# Patient Record
Sex: Female | Born: 1980 | Race: White | Hispanic: No | Marital: Married | Smoking: Former smoker
Health system: Southern US, Community
[De-identification: ages and names within clinical notes are randomized; demographics above are authoritative.]

## PROBLEM LIST (undated history)

## (undated) DIAGNOSIS — F909 Attention-deficit hyperactivity disorder, unspecified type: Secondary | ICD-10-CM

## (undated) DIAGNOSIS — T7840XA Allergy, unspecified, initial encounter: Secondary | ICD-10-CM

## (undated) HISTORY — DX: Allergy, unspecified, initial encounter: T78.40XA

## (undated) HISTORY — DX: Attention-deficit hyperactivity disorder, unspecified type: F90.9

## (undated) HISTORY — PX: WISDOM TOOTH EXTRACTION: SHX21

---

## 2003-04-14 ENCOUNTER — Emergency Department (HOSPITAL_COMMUNITY): Admission: EM | Admit: 2003-04-14 | Discharge: 2003-04-14 | Payer: Self-pay | Admitting: Emergency Medicine

## 2005-05-10 ENCOUNTER — Other Ambulatory Visit: Admission: RE | Admit: 2005-05-10 | Discharge: 2005-05-10 | Payer: Self-pay | Admitting: Gynecology

## 2006-05-12 ENCOUNTER — Other Ambulatory Visit: Admission: RE | Admit: 2006-05-12 | Discharge: 2006-05-12 | Payer: Self-pay | Admitting: Gynecology

## 2007-04-05 ENCOUNTER — Ambulatory Visit: Payer: Self-pay | Admitting: *Deleted

## 2007-04-27 ENCOUNTER — Encounter: Payer: Self-pay | Admitting: Family Medicine

## 2007-04-27 ENCOUNTER — Ambulatory Visit: Payer: Self-pay | Admitting: Family Medicine

## 2007-04-27 DIAGNOSIS — J309 Allergic rhinitis, unspecified: Secondary | ICD-10-CM | POA: Insufficient documentation

## 2007-04-27 DIAGNOSIS — L2089 Other atopic dermatitis: Secondary | ICD-10-CM | POA: Insufficient documentation

## 2007-04-27 DIAGNOSIS — L309 Dermatitis, unspecified: Secondary | ICD-10-CM | POA: Insufficient documentation

## 2007-04-27 DIAGNOSIS — F988 Other specified behavioral and emotional disorders with onset usually occurring in childhood and adolescence: Secondary | ICD-10-CM | POA: Insufficient documentation

## 2007-05-03 ENCOUNTER — Ambulatory Visit: Payer: Self-pay | Admitting: Family Medicine

## 2007-05-03 LAB — CONVERTED CEMR LAB
Basophils Absolute: 0 10*3/uL (ref 0.0–0.1)
Bilirubin, Direct: 0.1 mg/dL (ref 0.0–0.3)
Calcium: 9.2 mg/dL (ref 8.4–10.5)
Cholesterol: 173 mg/dL (ref 0–200)
Eosinophils Absolute: 0 10*3/uL (ref 0.0–0.6)
Eosinophils Relative: 0.5 % (ref 0.0–5.0)
GFR calc Af Amer: 155 mL/min
GFR calc non Af Amer: 128 mL/min
Glucose, Bld: 83 mg/dL (ref 70–99)
Lymphocytes Relative: 32.2 % (ref 12.0–46.0)
MCHC: 35.2 g/dL (ref 30.0–36.0)
MCV: 89.9 fL (ref 78.0–100.0)
Neutro Abs: 4.8 10*3/uL (ref 1.4–7.7)
Neutrophils Relative %: 60.2 % (ref 43.0–77.0)
Platelets: 270 10*3/uL (ref 150–400)
Sodium: 140 meq/L (ref 135–145)
TSH: 2.58 microintl units/mL (ref 0.35–5.50)
Triglycerides: 119 mg/dL (ref 0–149)
WBC: 7.9 10*3/uL (ref 4.5–10.5)

## 2007-05-23 ENCOUNTER — Telehealth (INDEPENDENT_AMBULATORY_CARE_PROVIDER_SITE_OTHER): Payer: Self-pay | Admitting: *Deleted

## 2007-05-24 ENCOUNTER — Other Ambulatory Visit: Admission: RE | Admit: 2007-05-24 | Discharge: 2007-05-24 | Payer: Self-pay | Admitting: Obstetrics and Gynecology

## 2007-06-12 ENCOUNTER — Telehealth (INDEPENDENT_AMBULATORY_CARE_PROVIDER_SITE_OTHER): Payer: Self-pay | Admitting: *Deleted

## 2007-07-13 ENCOUNTER — Telehealth (INDEPENDENT_AMBULATORY_CARE_PROVIDER_SITE_OTHER): Payer: Self-pay | Admitting: *Deleted

## 2007-08-10 ENCOUNTER — Telehealth (INDEPENDENT_AMBULATORY_CARE_PROVIDER_SITE_OTHER): Payer: Self-pay | Admitting: *Deleted

## 2007-09-07 ENCOUNTER — Telehealth (INDEPENDENT_AMBULATORY_CARE_PROVIDER_SITE_OTHER): Payer: Self-pay | Admitting: *Deleted

## 2007-10-06 ENCOUNTER — Telehealth (INDEPENDENT_AMBULATORY_CARE_PROVIDER_SITE_OTHER): Payer: Self-pay | Admitting: *Deleted

## 2007-11-02 ENCOUNTER — Telehealth (INDEPENDENT_AMBULATORY_CARE_PROVIDER_SITE_OTHER): Payer: Self-pay | Admitting: *Deleted

## 2007-11-03 ENCOUNTER — Ambulatory Visit: Payer: Self-pay | Admitting: Family Medicine

## 2007-11-27 ENCOUNTER — Encounter: Payer: Self-pay | Admitting: Family Medicine

## 2007-12-28 ENCOUNTER — Telehealth (INDEPENDENT_AMBULATORY_CARE_PROVIDER_SITE_OTHER): Payer: Self-pay | Admitting: *Deleted

## 2008-01-29 ENCOUNTER — Ambulatory Visit: Payer: Self-pay | Admitting: Family Medicine

## 2008-01-29 ENCOUNTER — Telehealth (INDEPENDENT_AMBULATORY_CARE_PROVIDER_SITE_OTHER): Payer: Self-pay | Admitting: *Deleted

## 2008-01-29 DIAGNOSIS — G5600 Carpal tunnel syndrome, unspecified upper limb: Secondary | ICD-10-CM | POA: Insufficient documentation

## 2008-02-12 ENCOUNTER — Telehealth (INDEPENDENT_AMBULATORY_CARE_PROVIDER_SITE_OTHER): Payer: Self-pay | Admitting: *Deleted

## 2008-02-23 ENCOUNTER — Telehealth (INDEPENDENT_AMBULATORY_CARE_PROVIDER_SITE_OTHER): Payer: Self-pay | Admitting: *Deleted

## 2008-03-22 ENCOUNTER — Telehealth (INDEPENDENT_AMBULATORY_CARE_PROVIDER_SITE_OTHER): Payer: Self-pay | Admitting: *Deleted

## 2008-04-19 ENCOUNTER — Telehealth (INDEPENDENT_AMBULATORY_CARE_PROVIDER_SITE_OTHER): Payer: Self-pay | Admitting: *Deleted

## 2008-05-17 ENCOUNTER — Telehealth (INDEPENDENT_AMBULATORY_CARE_PROVIDER_SITE_OTHER): Payer: Self-pay | Admitting: *Deleted

## 2008-05-27 ENCOUNTER — Other Ambulatory Visit: Admission: RE | Admit: 2008-05-27 | Discharge: 2008-05-27 | Payer: Self-pay | Admitting: Obstetrics and Gynecology

## 2008-06-12 ENCOUNTER — Telehealth (INDEPENDENT_AMBULATORY_CARE_PROVIDER_SITE_OTHER): Payer: Self-pay | Admitting: *Deleted

## 2008-07-12 ENCOUNTER — Telehealth (INDEPENDENT_AMBULATORY_CARE_PROVIDER_SITE_OTHER): Payer: Self-pay | Admitting: *Deleted

## 2008-08-08 ENCOUNTER — Telehealth (INDEPENDENT_AMBULATORY_CARE_PROVIDER_SITE_OTHER): Payer: Self-pay | Admitting: *Deleted

## 2008-08-16 ENCOUNTER — Ambulatory Visit: Payer: Self-pay | Admitting: Family Medicine

## 2008-08-16 DIAGNOSIS — F17200 Nicotine dependence, unspecified, uncomplicated: Secondary | ICD-10-CM | POA: Insufficient documentation

## 2008-09-04 ENCOUNTER — Telehealth (INDEPENDENT_AMBULATORY_CARE_PROVIDER_SITE_OTHER): Payer: Self-pay | Admitting: *Deleted

## 2008-10-03 ENCOUNTER — Telehealth (INDEPENDENT_AMBULATORY_CARE_PROVIDER_SITE_OTHER): Payer: Self-pay | Admitting: *Deleted

## 2008-10-29 ENCOUNTER — Telehealth (INDEPENDENT_AMBULATORY_CARE_PROVIDER_SITE_OTHER): Payer: Self-pay | Admitting: *Deleted

## 2008-11-28 ENCOUNTER — Telehealth (INDEPENDENT_AMBULATORY_CARE_PROVIDER_SITE_OTHER): Payer: Self-pay | Admitting: *Deleted

## 2008-12-26 ENCOUNTER — Telehealth (INDEPENDENT_AMBULATORY_CARE_PROVIDER_SITE_OTHER): Payer: Self-pay | Admitting: *Deleted

## 2009-01-22 ENCOUNTER — Telehealth (INDEPENDENT_AMBULATORY_CARE_PROVIDER_SITE_OTHER): Payer: Self-pay | Admitting: *Deleted

## 2009-02-21 ENCOUNTER — Telehealth (INDEPENDENT_AMBULATORY_CARE_PROVIDER_SITE_OTHER): Payer: Self-pay | Admitting: *Deleted

## 2009-03-19 ENCOUNTER — Ambulatory Visit: Payer: Self-pay | Admitting: Family Medicine

## 2009-03-20 ENCOUNTER — Ambulatory Visit: Payer: Self-pay | Admitting: Women's Health

## 2009-04-17 ENCOUNTER — Telehealth (INDEPENDENT_AMBULATORY_CARE_PROVIDER_SITE_OTHER): Payer: Self-pay | Admitting: *Deleted

## 2009-05-15 ENCOUNTER — Telehealth (INDEPENDENT_AMBULATORY_CARE_PROVIDER_SITE_OTHER): Payer: Self-pay | Admitting: *Deleted

## 2009-05-28 ENCOUNTER — Encounter: Payer: Self-pay | Admitting: Women's Health

## 2009-05-28 ENCOUNTER — Other Ambulatory Visit: Admission: RE | Admit: 2009-05-28 | Discharge: 2009-05-28 | Payer: Self-pay | Admitting: Obstetrics and Gynecology

## 2009-05-28 ENCOUNTER — Ambulatory Visit: Payer: Self-pay | Admitting: Women's Health

## 2009-06-12 ENCOUNTER — Telehealth (INDEPENDENT_AMBULATORY_CARE_PROVIDER_SITE_OTHER): Payer: Self-pay | Admitting: *Deleted

## 2009-07-09 ENCOUNTER — Telehealth (INDEPENDENT_AMBULATORY_CARE_PROVIDER_SITE_OTHER): Payer: Self-pay | Admitting: *Deleted

## 2009-08-05 ENCOUNTER — Telehealth (INDEPENDENT_AMBULATORY_CARE_PROVIDER_SITE_OTHER): Payer: Self-pay | Admitting: *Deleted

## 2009-09-02 ENCOUNTER — Ambulatory Visit: Payer: Self-pay | Admitting: Family Medicine

## 2009-10-01 ENCOUNTER — Telehealth (INDEPENDENT_AMBULATORY_CARE_PROVIDER_SITE_OTHER): Payer: Self-pay | Admitting: *Deleted

## 2009-10-29 ENCOUNTER — Telehealth (INDEPENDENT_AMBULATORY_CARE_PROVIDER_SITE_OTHER): Payer: Self-pay | Admitting: *Deleted

## 2009-11-26 ENCOUNTER — Telehealth (INDEPENDENT_AMBULATORY_CARE_PROVIDER_SITE_OTHER): Payer: Self-pay | Admitting: *Deleted

## 2009-12-25 ENCOUNTER — Telehealth (INDEPENDENT_AMBULATORY_CARE_PROVIDER_SITE_OTHER): Payer: Self-pay | Admitting: *Deleted

## 2010-01-22 ENCOUNTER — Telehealth (INDEPENDENT_AMBULATORY_CARE_PROVIDER_SITE_OTHER): Payer: Self-pay | Admitting: *Deleted

## 2010-02-18 ENCOUNTER — Telehealth (INDEPENDENT_AMBULATORY_CARE_PROVIDER_SITE_OTHER): Payer: Self-pay | Admitting: *Deleted

## 2010-03-18 ENCOUNTER — Ambulatory Visit: Payer: Self-pay | Admitting: Family Medicine

## 2010-03-18 DIAGNOSIS — L708 Other acne: Secondary | ICD-10-CM

## 2010-03-18 DIAGNOSIS — L700 Acne vulgaris: Secondary | ICD-10-CM | POA: Insufficient documentation

## 2010-04-16 ENCOUNTER — Telehealth (INDEPENDENT_AMBULATORY_CARE_PROVIDER_SITE_OTHER): Payer: Self-pay | Admitting: *Deleted

## 2010-05-14 ENCOUNTER — Telehealth (INDEPENDENT_AMBULATORY_CARE_PROVIDER_SITE_OTHER): Payer: Self-pay | Admitting: *Deleted

## 2010-05-29 ENCOUNTER — Ambulatory Visit: Payer: Self-pay | Admitting: Women's Health

## 2010-05-29 ENCOUNTER — Other Ambulatory Visit: Admission: RE | Admit: 2010-05-29 | Discharge: 2010-05-29 | Payer: Self-pay | Admitting: Obstetrics and Gynecology

## 2010-06-10 ENCOUNTER — Telehealth (INDEPENDENT_AMBULATORY_CARE_PROVIDER_SITE_OTHER): Payer: Self-pay | Admitting: *Deleted

## 2010-07-08 ENCOUNTER — Telehealth: Payer: Self-pay | Admitting: Family Medicine

## 2010-08-06 ENCOUNTER — Telehealth (INDEPENDENT_AMBULATORY_CARE_PROVIDER_SITE_OTHER): Payer: Self-pay | Admitting: *Deleted

## 2010-09-02 ENCOUNTER — Telehealth (INDEPENDENT_AMBULATORY_CARE_PROVIDER_SITE_OTHER): Payer: Self-pay | Admitting: *Deleted

## 2010-09-29 ENCOUNTER — Telehealth (INDEPENDENT_AMBULATORY_CARE_PROVIDER_SITE_OTHER): Payer: Self-pay | Admitting: *Deleted

## 2010-10-02 ENCOUNTER — Telehealth: Payer: Self-pay | Admitting: Family Medicine

## 2010-10-23 ENCOUNTER — Ambulatory Visit: Payer: Self-pay | Admitting: Family Medicine

## 2010-11-24 ENCOUNTER — Telehealth (INDEPENDENT_AMBULATORY_CARE_PROVIDER_SITE_OTHER): Payer: Self-pay | Admitting: *Deleted

## 2010-12-22 ENCOUNTER — Telehealth (INDEPENDENT_AMBULATORY_CARE_PROVIDER_SITE_OTHER): Payer: Self-pay | Admitting: *Deleted

## 2011-01-18 ENCOUNTER — Telehealth (INDEPENDENT_AMBULATORY_CARE_PROVIDER_SITE_OTHER): Payer: Self-pay | Admitting: *Deleted

## 2011-01-19 NOTE — Progress Notes (Signed)
Summary: refill  Phone Note Refill Request Message from:  Patient on July 08, 2010 1:04 PM  Refills Requested: Medication #1:  ADDERALL 10 MG  TABS 1 by mouth qam and 2 by mouth at 12noon patient will pick up - thursday [id number] afternoon  Initial call taken by: Okey Regal Spring,  July 08, 2010 1:05 PM  Follow-up for Phone Call        Please advise. Lucious Groves CMA  July 08, 2010 4:20 PM   Additional Follow-up for Phone Call Additional follow up Details #1::        rx placed up front for pick-up.Felecia Deloach CMA  July 09, 2010 12:05 PM     Prescriptions: ADDERALL 10 MG  TABS (AMPHETAMINE-DEXTROAMPHETAMINE) 1 by mouth qam and 2 by mouth at 12noon  #90 x 0   Entered by:   Jeremy Johann CMA   Authorized by:   Loreen Freud DO   Signed by:   Jeremy Johann CMA on 07/09/2010   Method used:   Print then Give to Patient   RxID:   [id number]

## 2011-01-19 NOTE — Progress Notes (Signed)
Summary: refill  Phone Note Refill Request Message from:  Patient on September 29, 2010 3:09 PM  Refills Requested: Medication #1:  ADDERALL 10 MG  TABS 1 by mouth qam and 2 by mouth at 12noon patient will pick up [id number]  Initial call taken by: Okey Regal Spring,  September 29, 2010 3:10 PM    Prescriptions: ADDERALL 10 MG  TABS (AMPHETAMINE-DEXTROAMPHETAMINE) 1 by mouth qam and 2 by mouth at 12noon  #90 x 0   Entered by:   Almeta Monas CMA (AAMA)   Authorized by:   Loreen Freud DO   Signed by:   Almeta Monas CMA (AAMA) on 09/29/2010   Method used:   Print then Give to Patient   RxID:   [id number]  Rx printed  and left at check In.   Almeta Monas CMA Duncan Dull)  September 29, 2010 4:54 PM

## 2011-01-19 NOTE — Progress Notes (Signed)
  Phone Note Refill Request   Refills Requested: Medication #1:  ADDERALL 10 MG  TABS 1 by mouth qam and 2 by mouth at 12noon  Follow-up for Phone Call        Pt aware rx is ready Army Fossa CMA  February 18, 2010 1:19 PM     Prescriptions: ADDERALL 10 MG  TABS (AMPHETAMINE-DEXTROAMPHETAMINE) 1 by mouth qam and 2 by mouth at 12noon  #90 x 0   Entered by:   Army Fossa CMA   Authorized by:   Loreen Freud DO   Signed by:   Army Fossa CMA on 02/18/2010   Method used:   Print then Give to Patient   RxID:   [id number]

## 2011-01-19 NOTE — Progress Notes (Signed)
Summary: Refill   Phone Note Refill Request Call back at Work Phone [phone] Message from:  Patient on September 02, 2010 9:11 AM  Refills Requested: Medication #1:  ADDERALL 10 MG  TABS 1 by mouth qam and 2 by mouth at 12noon   Dosage confirmed as above?Dosage Confirmed   Supply Requested: 3 months   Last Refilled: 08/06/2010  Method Requested: Pick up at Office Next Appointment Scheduled: none Initial call taken by: Lavell Islam,  September 02, 2010 9:11 AM    Prescriptions: ADDERALL 10 MG  TABS (AMPHETAMINE-DEXTROAMPHETAMINE) 1 by mouth qam and 2 by mouth at 12noon  #90 x 0   Entered by:   Almeta Monas CMA (AAMA)   Authorized by:   Loreen Freud DO   Signed by:   Almeta Monas CMA (AAMA) on 09/02/2010   Method used:   Print then Give to Patient   RxID:   [id number]

## 2011-01-19 NOTE — Progress Notes (Signed)
Summary: NEEDS ADDERRAL 10  PRESCRIPTION  Phone Note Call from Patient   Caller: Patient Summary of Call: NEEDS PRESCRIPTION FOR ADDERRAL 10  ---TAKES IT THREE TIMES A DAY  TOLD HER IT WOULD BE READY 24 HOURS FROM NOW Initial call taken by: Jerolyn Shin,  June 10, 2010 12:20 PM  Follow-up for Phone Call        upfront for pt to pick up. Army Fossa CMA  June 10, 2010 1:40 PM     Prescriptions: ADDERALL 10 MG  TABS (AMPHETAMINE-DEXTROAMPHETAMINE) 1 by mouth qam and 2 by mouth at 12noon  #90 x 0   Entered by:   Army Fossa CMA   Authorized by:   Loreen Freud DO   Signed by:   Army Fossa CMA on 06/10/2010   Method used:   Print then Give to Patient   RxID:   [id number]

## 2011-01-19 NOTE — Progress Notes (Signed)
Summary: Refill  Phone Note Refill Request Call back at Home Phone [phone] Message from:  Patient on November 24, 2010 12:33 PM  Refills Requested: Medication #1:  RITALIN 10 MG TABS 1 three times a day as needed   Dosage confirmed as above?Dosage Confirmed   Supply Requested: 1 month  Method Requested: Pick up at Office Initial call taken by: Lavell Islam,  November 24, 2010 12:33 PM  Follow-up for Phone Call        Pt aware Rx ready for pick up Follow-up by: Almeta Monas CMA Duncan Dull),  November 24, 2010 4:49 PM    Prescriptions: RITALIN 10 MG TABS (METHYLPHENIDATE HCL) 1 three times a day as needed  #90 x 0   Entered by:   Almeta Monas CMA (AAMA)   Authorized by:   Loreen Freud DO   Signed by:   Almeta Monas CMA (AAMA) on 11/24/2010   Method used:   Print then Give to Patient   RxID:   [id number]

## 2011-01-19 NOTE — Progress Notes (Signed)
Summary: Refill Request  Phone Note Refill Request Call back at Home Phone [phone] Message from:  Patient on May 14, 2010 12:14 PM  Refills Requested: Medication #1:  ADDERALL 10 MG  TABS 1 by mouth qam and 2 by mouth at 12noon   Dosage confirmed as above?Dosage Confirmed   Supply Requested: 1 month  Method Requested: Pick up at Office Next Appointment Scheduled: none Initial call taken by: Harold Barban,  May 14, 2010 12:14 PM  Follow-up for Phone Call        Pt is aware rx will be ready in 1 hr. Army Fossa CMA  May 14, 2010 12:57 PM     Prescriptions: ADDERALL 10 MG  TABS (AMPHETAMINE-DEXTROAMPHETAMINE) 1 by mouth qam and 2 by mouth at 12noon  #90 x 0   Entered by:   Army Fossa CMA   Authorized by:   Loreen Freud DO   Signed by:   Army Fossa CMA on 05/14/2010   Method used:   Print then Give to Patient   RxID:   [id number]

## 2011-01-19 NOTE — Progress Notes (Signed)
Summary: refill adderall  Phone Note Refill Request Call back at Work Phone [phone] Message from:  Patient on April 16, 2010 12:30 PM  Refills Requested: Medication #1:  ADDERALL 10 MG  TABS 1 by mouth qam and 2 by mouth at 12noon call when ready   Method Requested: Pick up at Office Initial call taken by: Okey Regal Spring,  April 16, 2010 12:31 PM  Follow-up for Phone Call        left message rx is ready. Army Fossa CMA  April 16, 2010 1:10 PM     Prescriptions: ADDERALL 10 MG  TABS (AMPHETAMINE-DEXTROAMPHETAMINE) 1 by mouth qam and 2 by mouth at 12noon  #90 x 0   Entered by:   Army Fossa CMA   Authorized by:   Loreen Freud DO   Signed by:   Army Fossa CMA on 04/16/2010   Method used:   Print then Give to Patient   RxID:   [id number]

## 2011-01-19 NOTE — Progress Notes (Signed)
Summary: refill request  Phone Note Refill Request Message from:  Patient on August 06, 2010 12:42 PM  Refills Requested: Medication #1:  ADDERALL 10 MG  TABS 1 by mouth qam and 2 by mouth at 12noon   Dosage confirmed as above?Dosage Confirmed   Supply Requested: 1 month   Last Refilled: 07/09/2010 pt aware ready for pickup in 24 hrs  Next Appointment Scheduled: none Initial call taken by: Lavell Islam,  August 06, 2010 12:43 PM    Prescriptions: ADDERALL 10 MG  TABS (AMPHETAMINE-DEXTROAMPHETAMINE) 1 by mouth qam and 2 by mouth at 12noon  #90 x 0   Entered by:   Jeremy Johann CMA   Authorized by:   Loreen Freud DO   Signed by:   Jeremy Johann CMA on 08/06/2010   Method used:   Print then Give to Patient   RxID:   [id number]

## 2011-01-19 NOTE — Progress Notes (Signed)
  Phone Note Refill Request   Refills Requested: Medication #1:  ADDERALL 10 MG  TABS 1 by mouth qam and 2 by mouth at 12noon  Follow-up for Phone Call        pt is aware rx is ready. Army Fossa CMA  January 22, 2010 10:54 AM     Prescriptions: ADDERALL 10 MG  TABS (AMPHETAMINE-DEXTROAMPHETAMINE) 1 by mouth qam and 2 by mouth at 12noon  #90 x 0   Entered by:   Army Fossa CMA   Authorized by:   Loreen Freud DO   Signed by:   Army Fossa CMA on 01/22/2010   Method used:   Print then Give to Patient   RxID:   [id number]

## 2011-01-19 NOTE — Progress Notes (Signed)
  Phone Note Refill Request   Refills Requested: Medication #1:  ADDERALL 10 MG  TABS 1 by mouth qam and 2 by mouth at 12noon Initial call taken by: Army Fossa CMA,  December 25, 2009 2:44 PM  Follow-up for Phone Call        left message that rx was ready.  Follow-up by: Army Fossa CMA,  December 25, 2009 2:45 PM    Prescriptions: ADDERALL 10 MG  TABS (AMPHETAMINE-DEXTROAMPHETAMINE) 1 by mouth qam and 2 by mouth at 12noon  #90 x 0   Entered by:   Army Fossa CMA   Authorized by:   Loreen Freud DO   Signed by:   Army Fossa CMA on 12/25/2009   Method used:   Print then Give to Patient   RxID:   [id number]

## 2011-01-19 NOTE — Assessment & Plan Note (Signed)
Summary: FU ON MEDS/KDC   Vital Signs:  Patient profile:   30 year old female Height:      66 inches Weight:      180.13 pounds BMI:     29.18 Temp:     97.7 degrees F oral Pulse rate:   76 / minute Pulse rhythm:   regular BP sitting:   122 / 80  (left arm) Cuff size:   regular  Vitals Entered By: Army Fossa CMA (March 18, 2010 9:33 AM) CC: follow up on meds.    History of Present Illness: Pt here for f/u add---pt doing well with dose and has stopped smoking except in very stressful situations.   Pt also c/o break out on R check.   No sure what its from.  She has a hx of eczema and skin is dry but also has some "pimples".    Current Medications (verified): 1)  Adderall 10 Mg  Tabs (Amphetamine-Dextroamphetamine) .Marland Kitchen.. 1 By Mouth Qam and 2 By Mouth At 12noon 2)  Nicotrol 10 Mg Inha (Nicotine) .... 6-16 Cartridges Inh Qd 3)  Estrostep Fe 1-20/1-30/1-35 Mg-Mcg Tabs (Norethindron-Ethinyl Estrad-Fe) .... As Directed 4)  Alprazolam 0.25 Mg Tabs (Alprazolam) .Marland Kitchen.. 1 By Mouth As Needed. 5)  Loestrin 1/20 (21) 1-20 Mg-Mcg Tabs (Norethindrone Acet-Ethinyl Est) .Marland Kitchen.. 1 By Mouth Once Daily. 6)  Cleocin-T 1 % Lotn (Clindamycin Phosphate) .... Apply Once Daily  Allergies (verified): No Known Drug Allergies  Past History:  Past medical, surgical, family and social histories (including risk factors) reviewed for relevance to current acute and chronic problems.  Past Medical History: Reviewed history from 04/27/2007 and no changes required. Allergic rhinitis  Family History: Reviewed history from 04/27/2007 and no changes required. Family History of Alcoholism/Addiction Family History Depression  Social History: Reviewed history from 04/27/2007 and no changes required. Occupation: Community education officer- data reporting Single Current Smoker Alcohol use-yes Drug use-no Regular exercise-yes  Review of Systems      See HPI  Physical Exam  General:  Well-developed,well-nourished,in no  acute distress; alert,appropriate and cooperative throughout examination Lungs:  Normal respiratory effort, chest expands symmetrically. Lungs are clear to auscultation, no crackles or wheezes. Heart:  normal rate and no murmur.   Skin:  + dry skin R cheek some papules Psych:  Oriented X3 and normally interactive.     Impression & Recommendations:  Problem # 1:  ADD (ICD-314.00)  refill adderall rto 6 months  Problem # 2:  TOBACCO ABUSE (ICD-305.1)  Her updated medication list for this problem includes:    Nicotrol 10 Mg Inha (Nicotine) .Marland Kitchen... 6-16 cartridges inh qd  Encouraged smoking cessation and discussed different methods for smoking cessation.   Problem # 3:  ACNE, MILD (ICD-706.1)  Her updated medication list for this problem includes:    Cleocin-t 1 % Lotn (Clindamycin phosphate) .Marland Kitchen... Apply once daily  Discussed care of the skin and different treatment options.   Complete Medication List: 1)  Adderall 10 Mg Tabs (Amphetamine-dextroamphetamine) .Marland Kitchen.. 1 by mouth qam and 2 by mouth at 12noon 2)  Nicotrol 10 Mg Inha (Nicotine) .... 6-16 cartridges inh qd 3)  Estrostep Fe 1-20/1-30/1-35 Mg-mcg Tabs (Norethindron-ethinyl estrad-fe) .... As directed 4)  Alprazolam 0.25 Mg Tabs (Alprazolam) .Marland Kitchen.. 1 by mouth as needed. 5)  Loestrin 1/20 (21) 1-20 Mg-mcg Tabs (Norethindrone acet-ethinyl est) .Marland Kitchen.. 1 by mouth once daily. 6)  Cleocin-t 1 % Lotn (Clindamycin phosphate) .... Apply once daily Prescriptions: CLEOCIN-T 1 % LOTN (CLINDAMYCIN PHOSPHATE) apply once daily  #1  bottle x 0   Entered and Authorized by:   Loreen Freud DO   Signed by:   Loreen Freud DO on 03/18/2010   Method used:   Electronically to        [facility] [phone]* (retail)       [address]       [place], [place]  [address]       Ph: [phone]       Fax: [phone]   RxID:   [id number] ADDERALL 10 MG  TABS (AMPHETAMINE-DEXTROAMPHETAMINE) 1 by mouth qam and 2 by mouth at  12noon  #90 x 0   Entered and Authorized by:   Loreen Freud DO   Signed by:   Loreen Freud DO on 03/18/2010   Method used:   Print then Give to Patient   RxID:   [id number]

## 2011-01-19 NOTE — Progress Notes (Signed)
Summary: Change Meds/dosage on Adderrall  Phone Note Call from Patient   Caller: Patient Call For: Loreen Freud DO Details for Reason: Change Meds Summary of Call: Pt came into the office and stated that the pharmacy that she uses dose not have the Adderrall in 10mg , it is only avail in 5mg , pt wants to know if she can get the Rx rewritten for 5mg  so she won't have to go to a different Pharmacy. Please advise Initial call taken by: Almeta Monas CMA Duncan Dull),  October 02, 2010 12:50 PM  Follow-up for Phone Call        done Follow-up by: Loreen Freud DO,  October 02, 2010 1:10 PM  Additional Follow-up for Phone Call Additional follow up Details #1::        Patient notified that prescription is up front for pickup. Per the patient her pharmacy made her aware that they may not have the total pills she needs, but they will give as much as they can and the she will most likely need a refill for the remaining qty.  Additional Follow-up by: Lucious Groves CMA,  October 02, 2010 1:21 PM    New/Updated Medications: ADDERALL 5 MG TABS (AMPHETAMINE-DEXTROAMPHETAMINE) 2 by mouth qam and 4 by mouth 12 noon Prescriptions: ADDERALL 5 MG TABS (AMPHETAMINE-DEXTROAMPHETAMINE) 2 by mouth qam and 4 by mouth 12 noon  #180 x 0   Entered and Authorized by:   Loreen Freud DO   Signed by:   Loreen Freud DO on 10/02/2010   Method used:   Print then Give to Patient   RxID:   [id number]

## 2011-01-19 NOTE — Assessment & Plan Note (Signed)
Summary: 6 month followup///sph   Vital Signs:  Patient profile:   30 year old female Weight:      181.2 pounds Pulse rate:   80 / minute Pulse rhythm:   regular BP sitting:   118 / 72  (right arm) Cuff size:   regular  Vitals Entered By: Almeta Monas CMA Duncan Dull) (October 23, 2010 10:42 AM) CC: 6 mo Adderall f/u   History of Present Illness: Pt here f/u adderall.  Pt doing well with it---- no side effects.   Pt willing to switch to ritalin since pharmacy is out of adderall.  Current Medications (verified): 1)  Ritalin 10 Mg Tabs (Methylphenidate Hcl) .Marland Kitchen.. 1 Three Times A Day As Needed 2)  Nicotrol 10 Mg Inha (Nicotine) .... 6-16 Cartridges Inh Qd 3)  Alprazolam 0.25 Mg Tabs (Alprazolam) .Marland Kitchen.. 1 By Mouth As Needed. 4)  Loestrin 1/20 (21) 1-20 Mg-Mcg Tabs (Norethindrone Acet-Ethinyl Est) .Marland Kitchen.. 1 By Mouth Once Daily. 5)  Cleocin-T 1 % Lotn (Clindamycin Phosphate) .... Apply Once Daily  Allergies (verified): No Known Drug Allergies  Past History:  Past medical, surgical, family and social histories (including risk factors) reviewed for relevance to current acute and chronic problems.  Past Medical History: Reviewed history from 04/27/2007 and no changes required. Allergic rhinitis  Family History: Reviewed history from 04/27/2007 and no changes required. Family History of Alcoholism/Addiction Family History Depression  Social History: Reviewed history from 04/27/2007 and no changes required. Occupation: Community education officer- data reporting Single Current Smoker Alcohol use-yes Drug use-no Regular exercise-yes  Review of Systems      See HPI  Physical Exam  General:  Well-developed,well-nourished,in no acute distress; alert,appropriate and cooperative throughout examination Lungs:  Normal respiratory effort, chest expands symmetrically. Lungs are clear to auscultation, no crackles or wheezes. Heart:  Normal rate and regular rhythm. S1 and S2 normal without gallop, murmur,  click, rub or other extra sounds. Psych:  Cognition and judgment appear intact. Alert and cooperative with normal attention span and concentration. No apparent delusions, illusions, hallucinations   Impression & Recommendations:  Problem # 1:  ADD (ICD-314.00) ritalin 10 mg 1 by mouth three times a day  rto 6 months or sooner prn  Complete Medication List: 1)  Ritalin 10 Mg Tabs (Methylphenidate hcl) .Marland Kitchen.. 1 three times a day as needed 2)  Nicotrol 10 Mg Inha (Nicotine) .... 6-16 cartridges inh qd 3)  Alprazolam 0.25 Mg Tabs (Alprazolam) .Marland Kitchen.. 1 by mouth as needed. 4)  Loestrin 1/20 (21) 1-20 Mg-mcg Tabs (Norethindrone acet-ethinyl est) .Marland Kitchen.. 1 by mouth once daily. 5)  Cleocin-t 1 % Lotn (Clindamycin phosphate) .... Apply once daily Prescriptions: RITALIN 10 MG TABS (METHYLPHENIDATE HCL) 1 three times a day as needed  #90 x 0   Entered and Authorized by:   Loreen Freud DO   Signed by:   Loreen Freud DO on 10/23/2010   Method used:   Print then Give to Patient   RxID:   [id number]    Orders Added: 1)  Est. Patient Level III [13244]

## 2011-01-21 NOTE — Progress Notes (Signed)
Summary: RX  Phone Note Refill Request Call back at Home Phone [phone] Message from:  Patient  Refills Requested: Medication #1:  RITALIN 10 MG TABS 1 three times a day as needed   Dosage confirmed as above?Dosage Confirmed   Brand Name Necessary? No   Supply Requested: 1 month TOTAL OF 90 PILLS FOR ONE MONTH.  Initial call taken by: Freddy Jaksch,  December 22, 2010 2:03 PM  Follow-up for Phone Call        pt aware Rx ready for pick up Follow-up by: Almeta Monas CMA Duncan Dull),  December 22, 2010 3:07 PM    Prescriptions: RITALIN 10 MG TABS (METHYLPHENIDATE HCL) 1 three times a day as needed  #90 x 0   Entered by:   Almeta Monas CMA (AAMA)   Authorized by:   Loreen Freud DO   Signed by:   Almeta Monas CMA (AAMA) on 12/22/2010   Method used:   Print then Give to Patient   RxID:   [id number]

## 2011-01-27 NOTE — Progress Notes (Signed)
Summary: adderral refill  Phone Note Refill Request Message from:  Patient on January 18, 2011 2:48 PM  Refills Requested: Medication #1:  ADDERRAL   Notes: DOES NOT WANT RITALIN ANYMORE SINCE ADDERRAL IS NOW AVAILABLE Patient needs Adderall prescription refilled.  Advised patient that it will be ready in 24 hours for pickup unless someone calls them  Initial call taken by: Jerolyn Shin,  January 18, 2011 2:49 PM  Follow-up for Phone Call        Pt aware RX ready for pick up.... Follow-up by: Almeta Monas CMA (AAMA),  January 18, 2011 3:07 PM    New/Updated Medications: ADDERALL 5 MG TABS (AMPHETAMINE-DEXTROAMPHETAMINE) take 2 by mouth qam and 4 by mouth at noon Prescriptions: ADDERALL 5 MG TABS (AMPHETAMINE-DEXTROAMPHETAMINE) take 2 by mouth qam and 4 by mouth at noon  #180 x 0   Entered by:   Almeta Monas CMA (AAMA)   Authorized by:   Loreen Freud DO   Signed by:   Almeta Monas CMA (AAMA) on 01/18/2011   Method used:   Print then Give to Patient   RxID:   [id number]

## 2011-02-17 ENCOUNTER — Telehealth: Payer: Self-pay | Admitting: Family Medicine

## 2011-02-25 NOTE — Progress Notes (Signed)
Summary: Adderral refill----requests 10 mg instead of 5 mg  Phone Note Refill Request Message from:  Patient on February 17, 2011 12:41 PM  Refills Requested: Medication #1:  ADDERALL 5 MG TABS take 2 by mouth qam and 4 by mouth at noon   Notes: NOTE****   says her pharmacy has the "10 mg"--can she get 90 pills of the 10 mg?? Patient needs Adderall prescription refilled.  Advised patient that it will be ready in 24 hours for pickup unless someone calls them  Initial call taken by: Jerolyn Shin,  February 17, 2011 12:42 PM  Follow-up for Phone Call        pt wants go back to the 10mg  Adderall--stated she was put on 5mg  when the pharmacy was out of 10's but they now have the 10mg  avail.Marland KitchenMarland KitchenMarland KitchenDirections are 1 in the am and 2 at 12 noon...please advise Follow-up by: Almeta Monas CMA Duncan Dull),  February 17, 2011 1:56 PM  Additional Follow-up for Phone Call Additional follow up Details #1::        ok to change back--- 1 month only Additional Follow-up by: Loreen Freud DO,  February 17, 2011 2:41 PM    Additional Follow-up for Phone Call Additional follow up Details #2::    pt aware Rx ready for pick up...Marland KitchenMarland KitchenMarland Kitchen Almeta Monas CMA (AAMA)  February 17, 2011 3:03 PM   New/Updated Medications: ADDERALL 10 MG TABS (AMPHETAMINE-DEXTROAMPHETAMINE) 1 by mouth in the morning and 2 by mouth at noon Prescriptions: ADDERALL 10 MG TABS (AMPHETAMINE-DEXTROAMPHETAMINE) 1 by mouth in the morning and 2 by mouth at noon  #90 x 0   Entered by:   Almeta Monas CMA (AAMA)   Authorized by:   Loreen Freud DO   Signed by:   Almeta Monas CMA (AAMA) on 02/17/2011   Method used:   Print then Give to Patient   RxID:   [id number]

## 2011-03-15 ENCOUNTER — Other Ambulatory Visit: Payer: Self-pay | Admitting: Family Medicine

## 2011-03-16 MED ORDER — AMPHETAMINE-DEXTROAMPHETAMINE 10 MG PO TABS: ORAL_TABLET | ORAL | Status: DC

## 2011-03-16 NOTE — Telephone Encounter (Signed)
Left detail message Rx ready for pick up.Felecia Aylinn Rydberg CMA

## 2011-04-16 ENCOUNTER — Telehealth: Payer: Self-pay | Admitting: Family Medicine

## 2011-04-16 NOTE — Telephone Encounter (Signed)
Will you fill in Dr.Lowne's absence. Thank You     KP

## 2011-04-16 NOTE — Telephone Encounter (Signed)
Patient needs prescription for Adderall 10 mg---needs to pick it up today

## 2011-04-18 NOTE — Telephone Encounter (Signed)
Ok if still needed

## 2011-04-19 MED ORDER — AMPHETAMINE-DEXTROAMPHETAMINE 10 MG PO TABS: ORAL_TABLET | ORAL | Status: DC

## 2011-04-19 NOTE — Telephone Encounter (Signed)
Rx printed, Pt pickup.

## 2011-05-13 ENCOUNTER — Telehealth: Payer: Self-pay | Admitting: Family Medicine

## 2011-05-13 NOTE — Telephone Encounter (Signed)
Refill adderall 10mg  three times a day - will pick up [id number] friday

## 2011-05-14 MED ORDER — AMPHETAMINE-DEXTROAMPHETAMINE 10 MG PO TABS: ORAL_TABLET | ORAL | Status: DC

## 2011-05-14 NOTE — Telephone Encounter (Signed)
Left detailed msg on pt voicemal that appt is due and rx up front for pick up.

## 2011-06-08 ENCOUNTER — Encounter: Payer: Self-pay | Admitting: Family Medicine

## 2011-06-11 ENCOUNTER — Encounter: Payer: Self-pay | Admitting: Family Medicine

## 2011-06-11 ENCOUNTER — Ambulatory Visit (INDEPENDENT_AMBULATORY_CARE_PROVIDER_SITE_OTHER): Payer: BC Managed Care – PPO | Admitting: Family Medicine

## 2011-06-11 VITALS — BP 116/68 | HR 99 | Temp 98.9°F | Wt 158.0 lb

## 2011-06-11 DIAGNOSIS — F988 Other specified behavioral and emotional disorders with onset usually occurring in childhood and adolescence: Secondary | ICD-10-CM

## 2011-06-11 MED ORDER — AMPHETAMINE-DEXTROAMPHETAMINE 10 MG PO TABS: ORAL_TABLET | ORAL | Status: DC

## 2011-06-11 NOTE — Progress Notes (Signed)
  Subjective:    Patient ID: Destiny Rangel, female    DOB: [birth year], 30 y.o.   MRN: [id number]  HPI  Pt here f/u  ADD --- no complaints.  Review of Systems    as above Objective:   Physical Exam  Constitutional: She is oriented to person, place, and time. She appears well-developed and well-nourished.  Cardiovascular: Normal rate and regular rhythm.   No murmur heard. Pulmonary/Chest: Effort normal and breath sounds normal. No respiratory distress. She has no wheezes. She has no rales. She exhibits no tenderness.  Musculoskeletal: She exhibits no edema.  Neurological: She is alert and oriented to person, place, and time.  Psychiatric: She has a normal mood and affect. Her behavior is normal. Judgment and thought content normal.          Assessment & Plan:

## 2011-06-11 NOTE — Patient Instructions (Signed)
Attention Deficit-Hyperactivity Disorder ADHD Attention deficit-hyperactivity disorder (ADHD) is a problem with behavior issues based on the way the brain functions (neurobehavioral disorder). It is a common reason for behavior and academic problems in school. CAUSES The cause of ADHD is unknown in most cases. It may run in families. It sometimes can be associated with learning disabilities and other behavioral problems. SYMPTOMS There are three types of ADHD. Some of the symptoms include:  Inattentive   Gets bored or distracted easily   Loses or forgets things. Forgets to hand in homework.   Has trouble organizing or completing tasks.   Difficulty staying on task.   An inability to organize daily tasks and school work.   Leaving projects, chores and homework unfinished.   Trouble paying attention or responding to details. Careless mistakes.   Difficulty following directions. Often seems like is not listening.   Dislikes activities that require sustained attention (like chores or homework).   Hyperactive-impulsive   Feels like it is impossible to sit still or stay in a seat. Fidgeting with hands and feet.   Trouble waiting turn.   Talking too much or out of turn. Interruptive.   Speaks or acts impulsively   Aggressive, disruptive behavior   Constantly busy or on the go, noisy.   Combined   Has symptoms of both of the above.  Often children with ADHD feel discouraged about themselves and with school. They often perform well below their abilities in school. These symptoms can cause problems in home, school, and in relationships with peers. As children get older, the excess motor activities can calm down, but the problems with paying attention and staying organized persist. Most children do not outgrow ADHD but with good treatment can learn to cope with the symptoms. DIAGNOSIS When ADHD is suspected, the diagnosis should be made by professionals trained in ADHD.    Diagnosis will include:  Ruling out other reasons for the child's behavior.   The caregivers will check with the child's school and check their medical records.   They will talk to teachers and parents.   Behavior rating scales for the child will be filled out by those dealing with the child on a daily basis.  A diagnosis is made only after all information has been considered. TREATMENT Treatment usually includes behavioral treatment often along with medicines. It may include stimulant medicines. The stimulant medicines decrease impulsivity and hyperactivity and increase attention. Other medicines used include antidepressants and certain blood pressure medicines. Most experts agree that treatment for ADHD should address all aspects of the child's functioning. Treatment should not be limited to the use of medicines alone. Treatment should include structured classroom management. The parents must receive education to address rewarding good behavior, discipline and limit-setting. Tutoring and/or behavioral therapy should be available for the child. If untreated, the disorder can have long term serious effects into adolescence and adulthood. HOMECARE INSTRUCTIONS   Often with ADHD there is a lot of frustration among the family in dealing with the illness. There is often blame and anger that is not warranted. This is a life long illness. There is no way to prevent ADHD. In many cases, because the problem affects the family as a whole, the entire family may need help. A therapist can help the family find better ways to handle the disruptive behaviors and promote change. If the child is young, most of the therapist's work is with the parents. Parents will learn techniques for coping with and improving their child's behavior.   Sometimes only the child with the ADHD needs counseling. Your caregivers can help you make these decisions.   Children with ADHD may need help in organizing. Here are some helpful  tips:   Keep routines the same every day from wake-up time to bedtime. Schedule everything. This includes homework and playtime. This should include outdoor and indoor recreation. Keep the schedule on the refrigerator or a bulletin board where it is frequently seen. Mark schedule changes as far in advance as possible.   Have a place for everything and keep everything in its place. This includes clothing, backpacks, and school supplies.   Encourage writing down assignments and bringing home needed books.   Offer your child a well-balanced diet. Breakfast is especially important for school performance. Children should avoid drinks with caffeine including:   Soft drinks.   Coffee.   Tea.   However, some older children (adolescents) may find these drinks helpful in improving their attention.   Children with ADHD need consistent rules that they can understand and follow. If rules are followed, give small rewards. Children with ADHD often receive, and expect, criticism. Look for good behavior and praise it. Set realistic goals. Give clear instructions. Look for activities that can foster success and self-esteem. Make time for pleasant activities with your child. Give lots of affection.   Parents are their children's greatest advocates. Learn as much as possible about ADHD. This helps you become a stronger and better advocate for your child. It also helps you educate your child's teachers and instructors if they feel inadequate in these areas. Parent support groups are often helpful. A national group with local chapters is called CHADD (Children and Adults with Attention Deficit/Hyperactivity Disorder).  PROGNOSIS  There is no cure for ADHD. Children with the disorder seldom outgrow it. Many find adaptive ways to accommodate the ADHD as they mature. SEEK MEDICAL CARE IF YOUR CHILD HAS:  Repeated muscle twitches, cough or speech outbursts.   Sleep problems.   Marked loss of appetite.    Depression.   New or worsening behavioral problems.   Dizziness.   Racing heart.   Stomach pains.   Headaches.  Document Released: 11/26/2002 Document Re-Released: 09/14/2008 ExitCare Patient Information 2011 [employer]. 

## 2011-06-11 NOTE — Assessment & Plan Note (Signed)
con't meds rto 6 months 

## 2011-07-02 ENCOUNTER — Other Ambulatory Visit: Payer: Self-pay | Admitting: Women's Health

## 2011-07-02 ENCOUNTER — Other Ambulatory Visit (HOSPITAL_COMMUNITY)
Admission: RE | Admit: 2011-07-02 | Discharge: 2011-07-02 | Disposition: A | Discharge status: Home / Self Care | Payer: BC Managed Care – PPO | Source: Ambulatory Visit | Attending: Obstetrics and Gynecology | Admitting: Obstetrics and Gynecology

## 2011-07-02 ENCOUNTER — Encounter (INDEPENDENT_AMBULATORY_CARE_PROVIDER_SITE_OTHER): Payer: BC Managed Care – PPO | Admitting: Women's Health

## 2011-07-02 DIAGNOSIS — Z01419 Encounter for gynecological examination (general) (routine) without abnormal findings: Secondary | ICD-10-CM

## 2011-07-02 DIAGNOSIS — Z113 Encounter for screening for infections with a predominantly sexual mode of transmission: Secondary | ICD-10-CM

## 2011-07-02 DIAGNOSIS — E079 Disorder of thyroid, unspecified: Secondary | ICD-10-CM

## 2011-07-02 DIAGNOSIS — Z124 Encounter for screening for malignant neoplasm of cervix: Secondary | ICD-10-CM | POA: Insufficient documentation

## 2011-09-08 ENCOUNTER — Other Ambulatory Visit: Payer: Self-pay | Admitting: Family Medicine

## 2011-09-08 DIAGNOSIS — F988 Other specified behavioral and emotional disorders with onset usually occurring in childhood and adolescence: Secondary | ICD-10-CM

## 2011-09-08 MED ORDER — AMPHETAMINE-DEXTROAMPHETAMINE 10 MG PO TABS: ORAL_TABLET | ORAL | Status: DC

## 2011-09-08 NOTE — Telephone Encounter (Signed)
VM left advising Rx ready for pick up.     KP 

## 2011-10-06 ENCOUNTER — Other Ambulatory Visit: Payer: Self-pay

## 2011-10-06 DIAGNOSIS — F988 Other specified behavioral and emotional disorders with onset usually occurring in childhood and adolescence: Secondary | ICD-10-CM

## 2011-10-06 MED ORDER — AMPHETAMINE-DEXTROAMPHETAMINE 10 MG PO TABS: ORAL_TABLET | ORAL | Status: DC

## 2011-12-08 ENCOUNTER — Telehealth: Payer: Self-pay | Admitting: Family Medicine

## 2011-12-08 DIAGNOSIS — F988 Other specified behavioral and emotional disorders with onset usually occurring in childhood and adolescence: Secondary | ICD-10-CM

## 2011-12-08 MED ORDER — AMPHETAMINE-DEXTROAMPHETAMINE 10 MG PO TABS: ORAL_TABLET | ORAL | Status: DC

## 2011-12-08 NOTE — Telephone Encounter (Signed)
Refill adderall - patient wants to pick up Thursday afternoon [phone]

## 2011-12-08 NOTE — Telephone Encounter (Signed)
Rx left at check in.      KP 

## 2011-12-31 ENCOUNTER — Ambulatory Visit (INDEPENDENT_AMBULATORY_CARE_PROVIDER_SITE_OTHER): Payer: BC Managed Care – PPO | Admitting: Family Medicine

## 2011-12-31 ENCOUNTER — Encounter: Payer: Self-pay | Admitting: Family Medicine

## 2011-12-31 VITALS — BP 108/70 | HR 99 | Temp 99.2°F | Wt 145.2 lb

## 2011-12-31 DIAGNOSIS — F988 Other specified behavioral and emotional disorders with onset usually occurring in childhood and adolescence: Secondary | ICD-10-CM

## 2011-12-31 DIAGNOSIS — H00019 Hordeolum externum unspecified eye, unspecified eyelid: Secondary | ICD-10-CM

## 2011-12-31 DIAGNOSIS — J069 Acute upper respiratory infection, unspecified: Secondary | ICD-10-CM

## 2011-12-31 MED ORDER — AMPHETAMINE-DEXTROAMPHETAMINE 10 MG PO TABS: ORAL_TABLET | ORAL | Status: DC

## 2011-12-31 MED ORDER — ERYTHROMYCIN 5 MG/GM OP OINT: TOPICAL_OINTMENT | Freq: Four times a day (QID) | OPHTHALMIC | Status: AC

## 2011-12-31 NOTE — Progress Notes (Signed)
  Subjective:    Patient ID: Destiny Rangel, female    DOB: 08/30/1981, 31 y.o.   MRN: [id number]  HPI Pt here c/o sty in Left eye that is getting bigger.  Pt does have a URI that is getting better.  Pt would also like refill on meds for add.  No other complaints.     Review of Systems As above    Objective:   Physical Exam  Constitutional: She is oriented to person, place, and time. She appears well-developed and well-nourished.  HENT:  Head: Normocephalic.  Right Ear: External ear normal.  Left Ear: External ear normal.  Mouth/Throat: Oropharynx is clear and moist.  Eyes:       + sty L eye, no drainage,  Sclera white  Neck: Normal range of motion. Neck supple.  Cardiovascular: Normal rate, regular rhythm and normal heart sounds.   No murmur heard. Pulmonary/Chest: Effort normal and breath sounds normal. No respiratory distress. She has no wheezes. She has no rales. She exhibits no tenderness.  Neurological: She is alert and oriented to person, place, and time.  Psychiatric: She has a normal mood and affect. Her behavior is normal. Thought content normal.          Assessment & Plan:  1 sty-- erythromycin oint,  Warm compresses,  To opth if no better in a couple of days 2.  URi-- chlortrimeton,  otc 3. ADD--- refill adderall

## 2011-12-31 NOTE — Patient Instructions (Signed)
Sty A sty (hordeolum) is an infection of a gland in the eyelid located at the base of the eyelash. A sty may develop a white or yellow head of pus. It can be puffy (swollen). Usually, the sty will burst and pus will come out on its own. They do not leave lumps in the eyelid once they drain. A sty is often confused with another form of cyst of the eyelid called a chalazion. Chalazions occur within the eyelid and not on the edge where the bases of the eyelashes are. They often are red, sore and then form firm lumps in the eyelid. CAUSES   Germs (bacteria).   Lasting (chronic) eyelid inflammation.  SYMPTOMS   Tenderness, redness and swelling along the edge of the eyelid at the base of the eyelashes.   Sometimes, there is a white or yellow head of pus. It may or may not drain.  DIAGNOSIS  An ophthalmologist will be able to distinguish between a sty and a chalazion and treat the condition appropriately.  TREATMENT   Styes are typically treated with warm packs (compresses) until drainage occurs.   In rare cases, medicines that kill germs (antibiotics) may be prescribed. These antibiotics may be in the form of drops, cream or pills.   If a hard lump has formed, it is generally necessary to do a small incision and remove the hardened contents of the cyst in a minor surgical procedure done in the office.   In suspicious cases, your caregiver may send the contents of the cyst to the lab to be certain that it is not a rare, but dangerous form of cancer of the glands of the eyelid.  HOME CARE INSTRUCTIONS   Wash your hands often and dry them with a clean towel. Avoid touching your eyelid. This may spread the infection to other parts of the eye.   Apply heat to your eyelid for 10 to 20 minutes, several times a day, to ease pain and help to heal it faster.   Do not squeeze the sty. Allow it to drain on its own. Wash your eyelid carefully 3 to 4 times per day to remove any pus.  SEEK IMMEDIATE  MEDICAL CARE IF:   Your eye becomes painful or puffy (swollen).   Your vision changes.   Your sty does not drain by itself within 3 days.   Your sty comes back within a short period of time, even with treatment.   You have redness (inflammation) around the eye.   You have a fever.  Document Released: 09/15/2005 Document Revised: 08/18/2011 Document Reviewed: 05/20/2009 ExitCare Patient Information 2012 [employer]. 

## 2012-04-06 ENCOUNTER — Other Ambulatory Visit: Payer: Self-pay | Admitting: *Deleted

## 2012-04-06 DIAGNOSIS — F988 Other specified behavioral and emotional disorders with onset usually occurring in childhood and adolescence: Secondary | ICD-10-CM

## 2012-04-06 MED ORDER — AMPHETAMINE-DEXTROAMPHETAMINE 10 MG PO TABS: ORAL_TABLET | ORAL | Status: DC

## 2012-04-06 NOTE — Telephone Encounter (Signed)
Pt called in to advise she needs a refill on her adderall, last OV 12-31-11 last refill same day noted #90 with no refills, however noted a second to fill after 02-09-12 and 90 more after 03-08-12

## 2012-04-06 NOTE — Telephone Encounter (Signed)
Placed RX at front desk for pt pick up, left vm for pt to advise that rx is ready.

## 2012-04-06 NOTE — Telephone Encounter (Signed)
Ok for #90, no refills 

## 2012-05-09 ENCOUNTER — Other Ambulatory Visit: Payer: Self-pay | Admitting: *Deleted

## 2012-05-09 DIAGNOSIS — F988 Other specified behavioral and emotional disorders with onset usually occurring in childhood and adolescence: Secondary | ICD-10-CM

## 2012-05-09 MED ORDER — AMPHETAMINE-DEXTROAMPHETAMINE 10 MG PO TABS: ORAL_TABLET | ORAL | Status: DC

## 2012-05-09 NOTE — Telephone Encounter (Signed)
Pt aware Rx ready for pick up 

## 2012-06-09 ENCOUNTER — Other Ambulatory Visit: Payer: Self-pay | Admitting: *Deleted

## 2012-06-09 DIAGNOSIS — F988 Other specified behavioral and emotional disorders with onset usually occurring in childhood and adolescence: Secondary | ICD-10-CM

## 2012-06-09 MED ORDER — AMPHETAMINE-DEXTROAMPHETAMINE 10 MG PO TABS: ORAL_TABLET | ORAL | Status: DC

## 2012-06-09 NOTE — Telephone Encounter (Signed)
Pt aware Rx will be ready after 2 pm today and to schedule 6 month f/u. Pt ok will schedule appt at pick up.

## 2012-07-11 ENCOUNTER — Encounter: Payer: Self-pay | Admitting: Family Medicine

## 2012-07-11 ENCOUNTER — Ambulatory Visit (INDEPENDENT_AMBULATORY_CARE_PROVIDER_SITE_OTHER): Payer: BC Managed Care – PPO | Admitting: Family Medicine

## 2012-07-11 VITALS — BP 100/72 | HR 107 | Wt 138.8 lb

## 2012-07-11 DIAGNOSIS — F988 Other specified behavioral and emotional disorders with onset usually occurring in childhood and adolescence: Secondary | ICD-10-CM

## 2012-07-11 MED ORDER — AMPHETAMINE-DEXTROAMPHETAMINE 10 MG PO TABS: ORAL_TABLET | ORAL | Status: DC

## 2012-07-12 NOTE — Progress Notes (Signed)
  Subjective:    Patient ID: Destiny Rangel, female    DOB: 22-Jul-1981, 31 y.o.   MRN: [id number]  HPI Pt here for f/u add. Doing well on meds  No complaints.   Review of Systems    as above Objective:   Physical Exam  Constitutional: She is oriented to person, place, and time. She appears well-developed and well-nourished.  Neurological: She is alert and oriented to person, place, and time.  Skin: Skin is warm and dry.  Psychiatric: She has a normal mood and affect. Her behavior is normal. Judgment and thought content normal.          Assessment & Plan:

## 2012-07-12 NOTE — Assessment & Plan Note (Signed)
Refill meds Recheck 6 months 

## 2012-07-13 ENCOUNTER — Other Ambulatory Visit: Payer: Self-pay | Admitting: *Deleted

## 2012-07-13 MED ORDER — NORETHIN ACE-ETH ESTRAD-FE 1-20 MG-MCG PO TABS: 1.0000 | ORAL_TABLET | Freq: Every day | ORAL | Status: DC

## 2012-07-19 ENCOUNTER — Ambulatory Visit (INDEPENDENT_AMBULATORY_CARE_PROVIDER_SITE_OTHER): Payer: BC Managed Care – PPO | Admitting: Women's Health

## 2012-07-19 ENCOUNTER — Encounter: Payer: Self-pay | Admitting: Women's Health

## 2012-07-19 VITALS — BP 118/76 | Ht 66.5 in | Wt 139.0 lb

## 2012-07-19 DIAGNOSIS — IMO0001 Reserved for inherently not codable concepts without codable children: Secondary | ICD-10-CM

## 2012-07-19 DIAGNOSIS — Z13220 Encounter for screening for lipoid disorders: Secondary | ICD-10-CM

## 2012-07-19 DIAGNOSIS — Z01419 Encounter for gynecological examination (general) (routine) without abnormal findings: Secondary | ICD-10-CM

## 2012-07-19 DIAGNOSIS — Z309 Encounter for contraceptive management, unspecified: Secondary | ICD-10-CM

## 2012-07-19 LAB — LIPID PANEL
Cholesterol: 177 mg/dL (ref 0–200)
Total CHOL/HDL Ratio: 3.1 Ratio
Triglycerides: 95 mg/dL (ref <150)
VLDL: 19 mg/dL (ref 0–40)

## 2012-07-19 LAB — CBC WITH DIFFERENTIAL/PLATELET
Basophils Relative: 1 % (ref 0–1)
Eosinophils Absolute: 0.1 10*3/uL (ref 0.0–0.7)
Eosinophils Relative: 2 % (ref 0–5)
Lymphs Abs: 2.8 10*3/uL (ref 0.7–4.0)
MCH: 31.6 pg (ref 26.0–34.0)
MCHC: 34.2 g/dL (ref 30.0–36.0)
MCV: 92.3 fL (ref 78.0–100.0)
Neutrophils Relative %: 40 % — ABNORMAL LOW (ref 43–77)
Platelets: 281 10*3/uL (ref 150–400)
RBC: 4.69 MIL/uL (ref 3.87–5.11)
RDW: 13.2 % (ref 11.5–15.5)

## 2012-07-19 MED ORDER — NORETHIN ACE-ETH ESTRAD-FE 1-20 MG-MCG PO TABS: 1.0000 | ORAL_TABLET | Freq: Every day | ORAL | Status: DC

## 2012-07-19 NOTE — Progress Notes (Signed)
Destiny Rangel 27-Jun-1981 [id number]    History:    The patient presents for annual exam.  Light one to 2 day cycle on Loestrin 1/20/same partner. Gardasil series completed in 2008. History of normal Paps. Has lost 18 pounds with diet and exercise.   Past medical history, past surgical history, family history and social history were all reviewed and documented in the EPIC chart. 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   ROS:  A  ROS was performed and pertinent positives and negatives are included in the history.  Exam:  Filed Vitals:   07/19/12 0900  BP: 118/76    General appearance:  Normal Head/Neck:  Normal, without cervical or supraclavicular adenopathy. Thyroid:  Symmetrical, normal in size, without palpable masses or nodularity. Respiratory  Effort:  Normal  Auscultation:  Clear without wheezing or rhonchi Cardiovascular  Auscultation:  Regular rate, without rubs, murmurs or gallops  Edema/varicosities:  Not grossly evident Abdominal  Soft,nontender, without masses, guarding or rebound.  Liver/spleen:  No organomegaly noted  Hernia:  None appreciated  Skin  Inspection:  Grossly normal  Palpation:  Grossly normal Neurologic/psychiatric  Orientation:  Normal with appropriate conversation.  Mood/affect:  Normal  Genitourinary    Breasts: Examined lying and sitting.     Right: Without masses, retractions, discharge or axillary adenopathy.     Left: Without masses, retractions, discharge or axillary adenopathy.   Inguinal/mons:  Normal without inguinal adenopathy  External genitalia:  Normal  BUS/Urethra/Skene's glands:  Normal  Bladder:  Normal  Vagina:  Normal  Cervix:  Normal  Uterus:   normal in size, shape and contour.  Midline and mobile  Adnexa/parametria:     Rt: Without masses or tenderness.   Lt: Without masses or tenderness.  Anus and perineum: Normal  Digital rectal exam: Normal sphincter tone without palpated masses or  tenderness  Assessment/Plan:  31 y.o. S. WF G0 for annual exam with no complaints.  Normal GYN exam on Loestrin 1/20 ADD-Adderall primary care manages  Plan: Loestrin 1/20 prescription, proper use, slight risk for blood clots and strokes reviewed. SBE's, continue healthy exercise and diet routine. MVI daily encouraged. CBC, lipid panel, UA. No Pap history of normal Paps, new screening guidelines reviewed.      Harrington Challenger [facility], 9:38 AM 07/19/2012

## 2012-07-19 NOTE — Patient Instructions (Signed)

## 2012-07-20 LAB — URINALYSIS W MICROSCOPIC + REFLEX CULTURE
Bacteria, UA: NONE SEEN
Casts: NONE SEEN
Ketones, ur: NEGATIVE mg/dL
Leukocytes, UA: NEGATIVE
Nitrite: NEGATIVE
Protein, ur: NEGATIVE mg/dL
pH: 6.5 (ref 5.0–8.0)

## 2012-08-08 ENCOUNTER — Other Ambulatory Visit: Payer: Self-pay | Admitting: *Deleted

## 2012-08-08 DIAGNOSIS — F988 Other specified behavioral and emotional disorders with onset usually occurring in childhood and adolescence: Secondary | ICD-10-CM

## 2012-08-08 MED ORDER — AMPHETAMINE-DEXTROAMPHETAMINE 10 MG PO TABS: ORAL_TABLET | ORAL | Status: DC

## 2012-08-08 NOTE — Telephone Encounter (Signed)
Pt aware Rx ready for pick up 

## 2012-09-07 ENCOUNTER — Other Ambulatory Visit: Payer: Self-pay | Admitting: Family Medicine

## 2012-09-07 DIAGNOSIS — F988 Other specified behavioral and emotional disorders with onset usually occurring in childhood and adolescence: Secondary | ICD-10-CM

## 2012-09-07 MED ORDER — AMPHETAMINE-DEXTROAMPHETAMINE 10 MG PO TABS: ORAL_TABLET | ORAL | Status: DC

## 2012-09-07 NOTE — Telephone Encounter (Signed)
VM left advising Rx ready for pick up.     KP 

## 2012-09-07 NOTE — Telephone Encounter (Signed)
Patient left vm on triage line requesting refill for Adderall 10mg  #90. Call [phone] when ready.

## 2012-10-05 ENCOUNTER — Other Ambulatory Visit: Payer: Self-pay

## 2012-10-05 DIAGNOSIS — F988 Other specified behavioral and emotional disorders with onset usually occurring in childhood and adolescence: Secondary | ICD-10-CM

## 2012-10-05 MED ORDER — AMPHETAMINE-DEXTROAMPHETAMINE 10 MG PO TABS: ORAL_TABLET | ORAL | Status: DC

## 2012-10-05 NOTE — Telephone Encounter (Signed)
OV 07/19/12. Adderall Last filled 09/07/12 #90 no refills  Plz advise   MW

## 2012-10-05 NOTE — Telephone Encounter (Signed)
Called left message advising pt Rx ready for pick up.     MW

## 2012-11-02 ENCOUNTER — Other Ambulatory Visit: Payer: Self-pay | Admitting: *Deleted

## 2012-11-02 DIAGNOSIS — F988 Other specified behavioral and emotional disorders with onset usually occurring in childhood and adolescence: Secondary | ICD-10-CM

## 2012-11-02 MED ORDER — AMPHETAMINE-DEXTROAMPHETAMINE 10 MG PO TABS: ORAL_TABLET | ORAL | Status: DC

## 2012-11-02 NOTE — Telephone Encounter (Signed)
VM left advising Rx ready pick up.Marland Kitchen    KP

## 2012-11-02 NOTE — Telephone Encounter (Signed)
Pt left msg on triage vmail requesting a refill for adderall 10mg . Last OV 7.13.13 Last filled 10.17.13. OK to refill?

## 2012-11-02 NOTE — Telephone Encounter (Signed)
Pt seen in last 6 months than ok to refill for 1 month

## 2012-11-30 ENCOUNTER — Other Ambulatory Visit: Payer: Self-pay | Admitting: *Deleted

## 2012-11-30 ENCOUNTER — Encounter: Payer: Self-pay | Admitting: *Deleted

## 2012-11-30 DIAGNOSIS — F988 Other specified behavioral and emotional disorders with onset usually occurring in childhood and adolescence: Secondary | ICD-10-CM

## 2012-11-30 MED ORDER — AMPHETAMINE-DEXTROAMPHETAMINE 10 MG PO TABS: ORAL_TABLET | ORAL | Status: DC

## 2012-11-30 NOTE — Telephone Encounter (Signed)
Left Pt detail message Rx ready for pick up.

## 2012-12-27 ENCOUNTER — Other Ambulatory Visit: Payer: Self-pay | Admitting: *Deleted

## 2012-12-27 DIAGNOSIS — F988 Other specified behavioral and emotional disorders with onset usually occurring in childhood and adolescence: Secondary | ICD-10-CM

## 2012-12-27 MED ORDER — AMPHETAMINE-DEXTROAMPHETAMINE 10 MG PO TABS: ORAL_TABLET | ORAL | Status: DC

## 2012-12-27 NOTE — Telephone Encounter (Signed)
Left Pt detail message Rx ready for pickup 

## 2013-01-26 ENCOUNTER — Other Ambulatory Visit: Payer: Self-pay | Admitting: *Deleted

## 2013-01-26 DIAGNOSIS — F988 Other specified behavioral and emotional disorders with onset usually occurring in childhood and adolescence: Secondary | ICD-10-CM

## 2013-01-26 MED ORDER — AMPHETAMINE-DEXTROAMPHETAMINE 10 MG PO TABS: ORAL_TABLET | ORAL | Status: DC

## 2013-01-26 NOTE — Telephone Encounter (Signed)
Pt aware Rx ready for pick up 

## 2013-02-23 ENCOUNTER — Ambulatory Visit: Payer: BC Managed Care – PPO | Admitting: Family Medicine

## 2013-02-26 ENCOUNTER — Telehealth: Payer: Self-pay | Admitting: Family Medicine

## 2013-02-26 NOTE — Telephone Encounter (Signed)
Pt request adderall 10 mg 3 times a day

## 2013-02-27 ENCOUNTER — Ambulatory Visit (INDEPENDENT_AMBULATORY_CARE_PROVIDER_SITE_OTHER): Payer: BC Managed Care – PPO | Admitting: Family Medicine

## 2013-02-27 ENCOUNTER — Encounter: Payer: Self-pay | Admitting: Family Medicine

## 2013-02-27 VITALS — BP 132/80 | HR 118 | Temp 99.3°F | Wt 128.0 lb

## 2013-02-27 DIAGNOSIS — F988 Other specified behavioral and emotional disorders with onset usually occurring in childhood and adolescence: Secondary | ICD-10-CM

## 2013-02-27 MED ORDER — AMPHETAMINE-DEXTROAMPHETAMINE 10 MG PO TABS
ORAL_TABLET | ORAL | Status: DC
Start: 2013-02-27 — End: 2013-05-25

## 2013-02-27 MED ORDER — AMPHETAMINE-DEXTROAMPHETAMINE 10 MG PO TABS: ORAL_TABLET | ORAL | Status: DC

## 2013-02-27 NOTE — Telephone Encounter (Signed)
Last seen 07/11/12 and filled 01/26/13. Patient aware apt due, nothing pending. Please advise     KP

## 2013-02-27 NOTE — Progress Notes (Signed)
  Subjective:    Patient ID: Destiny Rangel, female    DOB: 1981-08-05, 32 y.o.   MRN: [id number]  HPI Pt here f/u add.  No complaints.     Review of Systems As above    Objective:   Physical Exam  BP 132/80  Pulse 118  Temp(Src) 99.3 F (37.4 C) (Oral)  Wt 128 lb (58.06 kg)  BMI 20.35 kg/m2  SpO2 98% General appearance: alert, cooperative, appears stated age and no distress Lungs: clear to auscultation bilaterally Heart: S1, S2 normal      Assessment & Plan:

## 2013-02-27 NOTE — Assessment & Plan Note (Signed)
Refill meds rto 6 months 

## 2013-02-27 NOTE — Telephone Encounter (Signed)
I made the patient aware and she said she had to reschedule due to the weather. Apt scheduled for today      KP

## 2013-02-27 NOTE — Telephone Encounter (Signed)
Needs appointment

## 2013-03-29 ENCOUNTER — Ambulatory Visit (INDEPENDENT_AMBULATORY_CARE_PROVIDER_SITE_OTHER): Payer: BC Managed Care – PPO | Admitting: Family Medicine

## 2013-03-29 ENCOUNTER — Encounter: Payer: Self-pay | Admitting: Family Medicine

## 2013-03-29 VITALS — BP 114/80 | HR 116 | Temp 98.6°F | Wt 129.2 lb

## 2013-03-29 DIAGNOSIS — R259 Unspecified abnormal involuntary movements: Secondary | ICD-10-CM

## 2013-03-29 DIAGNOSIS — R319 Hematuria, unspecified: Secondary | ICD-10-CM

## 2013-03-29 DIAGNOSIS — R251 Tremor, unspecified: Secondary | ICD-10-CM | POA: Insufficient documentation

## 2013-03-29 LAB — CBC WITH DIFFERENTIAL/PLATELET
Basophils Absolute: 0 10*3/uL (ref 0.0–0.1)
Eosinophils Absolute: 0 10*3/uL (ref 0.0–0.7)
Hemoglobin: 13.5 g/dL (ref 12.0–15.0)
Lymphocytes Relative: 35.1 % (ref 12.0–46.0)
MCHC: 33.9 g/dL (ref 30.0–36.0)
Monocytes Relative: 7.3 % (ref 3.0–12.0)
Neutro Abs: 3.5 10*3/uL (ref 1.4–7.7)
Neutrophils Relative %: 56.8 % (ref 43.0–77.0)
RDW: 13.3 % (ref 11.5–14.6)

## 2013-03-29 LAB — HEPATIC FUNCTION PANEL
AST: 20 U/L (ref 0–37)
Albumin: 4.3 g/dL (ref 3.5–5.2)
Alkaline Phosphatase: 35 U/L — ABNORMAL LOW (ref 39–117)
Bilirubin, Direct: 0.1 mg/dL (ref 0.0–0.3)
Total Bilirubin: 0.4 mg/dL (ref 0.3–1.2)

## 2013-03-29 LAB — BASIC METABOLIC PANEL
CO2: 25 mEq/L (ref 19–32)
Calcium: 9.1 mg/dL (ref 8.4–10.5)
Creatinine, Ser: 0.5 mg/dL (ref 0.4–1.2)
GFR: 162.42 mL/min (ref >60.00)
Glucose, Bld: 82 mg/dL (ref 70–99)
Sodium: 135 mEq/L (ref 135–145)

## 2013-03-29 LAB — POCT URINALYSIS DIPSTICK
Bilirubin, UA: NEGATIVE
Glucose, UA: NEGATIVE
Ketones, UA: NEGATIVE
Nitrite, UA: NEGATIVE
Spec Grav, UA: <=1.005
pH, UA: 6

## 2013-03-29 LAB — T3, FREE: T3, Free: 2.7 pg/mL (ref 2.3–4.2)

## 2013-03-29 LAB — T4, FREE: Free T4: 0.6 ng/dL (ref 0.60–1.60)

## 2013-03-29 MED ORDER — ALPRAZOLAM 0.25 MG PO TABS: 0.2500 mg | ORAL_TABLET | Freq: Three times a day (TID) | ORAL | Status: DC | PRN

## 2013-03-29 NOTE — Patient Instructions (Addendum)
Tremor  Tremor is a rhythmic, involuntary muscular contraction characterized by oscillations (to-and-fro movements) of a part of the body. The most common of all involuntary movements, tremor can affect various body parts such as the hands, head, facial structures, vocal cords, trunk, and legs; most tremors, however, occur in the hands. Tremor often accompanies neurological disorders associated with aging. Although the disorder is not life-threatening, it can be responsible for functional disability and social embarrassment.  TREATMENT   There are many types of tremor and several ways in which tremor is classified. The most common classification is by behavioral context or position. There are five categories of tremor within this classification: resting, postural, kinetic, task-specific, and psychogenic. Resting or static tremor occurs when the muscle is at rest, for example when the hands are lying on the lap. This type of tremor is often seen in patients with Parkinson's disease. Postural tremor occurs when a patient attempts to maintain posture, such as holding the hands outstretched. Postural tremors include physiological tremor, essential tremor, tremor with basal ganglia disease (also seen in patients with Parkinson's disease), cerebellar postural tremor, tremor with peripheral neuropathy, post-traumatic tremor, and alcoholic tremor. Kinetic or intention (action) tremor occurs during purposeful movement, for example during finger-to-nose testing. Task-specific tremor appears when performing goal-oriented tasks such as handwriting, speaking, or standing. This group consists of primary writing tremor, vocal tremor, and orthostatic tremor. Psychogenic tremor occurs in both older and younger patients. The key feature of this tremor is that it dramatically lessens or disappears when the patient is distracted.  PROGNOSIS  There are some treatment options available for tremor; the appropriate treatment depends on  accurate diagnosis of the cause. Some tremors respond to treatment of the underlying condition, for example in some cases of psychogenic tremor treating the patient's underlying mental problem may cause the tremor to disappear. Also, patients with tremor due to Parkinson's disease may be treated with Levodopa drug therapy. Symptomatic drug therapy is available for several other tremors as well. For those cases of tremor in which there is no effective drug treatment, physical measures such as teaching the patient to brace the affected limb during the tremor are sometimes useful. Surgical intervention such as thalamotomy or deep brain stimulation may be useful in certain cases.  Document Released: 11/26/2002 Document Revised: 02/28/2012 Document Reviewed: 12/06/2005  ExitCare Patient Information 2013 [employer].

## 2013-03-29 NOTE — Progress Notes (Signed)
  Subjective:    Patient ID: Destiny Rangel, female    DOB: April 08, 1981, 32 y.o.   MRN: [id number]  HPI Pt here c/o dec app with wt loss , no constipation , flushed skin, skaking hands, dry itchy skin and +hx eczema but no clearing.  She is always hot.  She is under inc stress and recent promotion.     Review of Systems As above    Objective:   Physical Exam BP 114/80  Pulse 116  Temp(Src) 98.6 F (37 C) (Oral)  Wt 129 lb 3.2 oz (58.605 kg)  BMI 20.54 kg/m2  SpO2 99% General appearance: alert, cooperative, appears stated age and no distress Neck: no adenopathy, supple, symmetrical, trachea midline and thyroid not enlarged, symmetric, no tenderness/mass/nodules Lungs: clear to auscultation bilaterally Heart: S1, S2 normal Abdomen: soft, non-tender; bowel sounds normal; no masses,  no organomegaly Neuro--- + tremor/ shaking both arms, hands      Assessment & Plan:

## 2013-03-29 NOTE — Assessment & Plan Note (Signed)
Check labs ? Secondary to anxiety----xanax 0.25 mg 1 po tid prn  F/u 4-6 weeks

## 2013-03-31 LAB — URINE CULTURE: Colony Count: 45000

## 2013-04-04 ENCOUNTER — Encounter: Payer: Self-pay | Admitting: Family Medicine

## 2013-04-05 ENCOUNTER — Encounter: Payer: Self-pay | Admitting: Family Medicine

## 2013-04-21 ENCOUNTER — Encounter: Payer: Self-pay | Admitting: Family Medicine

## 2013-05-25 ENCOUNTER — Telehealth: Payer: Self-pay | Admitting: General Practice

## 2013-05-25 DIAGNOSIS — F988 Other specified behavioral and emotional disorders with onset usually occurring in childhood and adolescence: Secondary | ICD-10-CM

## 2013-05-25 MED ORDER — AMPHETAMINE-DEXTROAMPHETAMINE 10 MG PO TABS: ORAL_TABLET | ORAL | Status: DC

## 2013-05-25 NOTE — Telephone Encounter (Signed)
Adderall Refill Last OV 03-29-13 Med last filled for 90 days.

## 2013-05-25 NOTE — Telephone Encounter (Signed)
Med filled. Pt notified.  

## 2013-05-25 NOTE — Telephone Encounter (Signed)
Ok to refill 

## 2013-07-12 ENCOUNTER — Encounter: Payer: Self-pay | Admitting: Family Medicine

## 2013-08-17 ENCOUNTER — Other Ambulatory Visit (HOSPITAL_COMMUNITY)
Admission: RE | Admit: 2013-08-17 | Discharge: 2013-08-17 | Disposition: A | Discharge status: Home / Self Care | Payer: BC Managed Care – PPO | Source: Ambulatory Visit | Attending: Gynecology | Admitting: Gynecology

## 2013-08-17 ENCOUNTER — Encounter: Payer: Self-pay | Admitting: Women's Health

## 2013-08-17 ENCOUNTER — Ambulatory Visit (INDEPENDENT_AMBULATORY_CARE_PROVIDER_SITE_OTHER): Payer: BC Managed Care – PPO | Admitting: Women's Health

## 2013-08-17 VITALS — BP 142/90 | Ht 66.0 in | Wt 132.0 lb

## 2013-08-17 DIAGNOSIS — IMO0001 Reserved for inherently not codable concepts without codable children: Secondary | ICD-10-CM

## 2013-08-17 DIAGNOSIS — Z309 Encounter for contraceptive management, unspecified: Secondary | ICD-10-CM

## 2013-08-17 DIAGNOSIS — Z01419 Encounter for gynecological examination (general) (routine) without abnormal findings: Secondary | ICD-10-CM

## 2013-08-17 DIAGNOSIS — Z113 Encounter for screening for infections with a predominantly sexual mode of transmission: Secondary | ICD-10-CM

## 2013-08-17 MED ORDER — NORETHIN ACE-ETH ESTRAD-FE 1-20 MG-MCG PO TABS: 1.0000 | ORAL_TABLET | Freq: Every day | ORAL | Status: DC

## 2013-08-17 NOTE — Progress Notes (Signed)
Destiny Rangel 10/21/1981 [id number]    History:    The patient presents for annual exam.  Regular cycle on Loestrin 1/20. Gardasil series completed 2008. Normal Pap history. Normal labs at work: cholesterol 191, HDL 90, glucose 94. New partner with condoms. Has lost approximately 50 pounds in the past 3 years with diet and exercise.   Past medical history, past surgical history, family history and social history were all reviewed and documented in the EPIC chart. MBA 2013 with Verne [employer]. Parents skin cancer, no breast cancer history in family.   ROS:  A  ROS was performed and pertinent positives and negatives are included in the history.  Exam:  Filed Vitals:   08/17/13 1507  BP: 142/90    General appearance:  Normal Head/Neck:  Normal, without cervical or supraclavicular adenopathy. Thyroid:  Symmetrical, normal in size, without palpable masses or nodularity. Respiratory  Effort:  Normal  Auscultation:  Clear without wheezing or rhonchi Cardiovascular  Auscultation:  Regular rate, without rubs, murmurs or gallops  Edema/varicosities:  Not grossly evident Abdominal  Soft,nontender, without masses, guarding or rebound.  Liver/spleen:  No organomegaly noted  Hernia:  None appreciated  Skin  Inspection:  Grossly normal  Palpation:  Grossly normal Neurologic/psychiatric  Orientation:  Normal with appropriate conversation.  Mood/affect:  Normal  Genitourinary    Breasts: Examined lying and sitting.     Right: Without masses, retractions, discharge or axillary adenopathy.     Left: Without masses, retractions, discharge or axillary adenopathy.   Inguinal/mons:  Normal without inguinal adenopathy  External genitalia:  Normal  BUS/Urethra/Skene's glands:  Normal  Bladder:  Normal  Vagina:  Normal  Cervix:  Normal  Uterus:   normal in size, shape and contour.  Midline and mobile  Adnexa/parametria:     Rt: Without masses or tenderness.   Lt: Without masses or tenderness.  Anus  and perineum: Normal  Digital rectal exam: Normal sphincter tone without palpated masses or tenderness  Assessment/Plan:  32 y.o. S. WF G0 for annual exam with no complaints.  Normal GYN exam on Loestrin 1/20  Plan: Loestrin 1/20 prescription, proper use given and reviewed slight risk for blood clots and strokes. Condoms encouraged if sexually active. SBE's, continue healthy exercise, and diet for weight maintenance. MVI daily encouraged. UA, Pap. Pap normal 2012, new screening guidelines reviewed. Blood pressure elevated today, was 128/70 at health screening, will monitor if continues elevated will seek care with primary care.  Harrington Challenger [facility], 5:26 PM 08/17/2013

## 2013-08-17 NOTE — Patient Instructions (Addendum)

## 2013-08-18 LAB — URINALYSIS W MICROSCOPIC + REFLEX CULTURE
Bacteria, UA: NONE SEEN
Casts: NONE SEEN
Hgb urine dipstick: NEGATIVE
Ketones, ur: NEGATIVE mg/dL
Leukocytes, UA: NEGATIVE
Nitrite: NEGATIVE
Protein, ur: NEGATIVE mg/dL
Urobilinogen, UA: 0.2 mg/dL (ref 0.0–1.0)
pH: 6 (ref 5.0–8.0)

## 2013-08-18 LAB — GC/CHLAMYDIA PROBE AMP
CT Probe RNA: NEGATIVE
GC Probe RNA: NEGATIVE

## 2013-08-23 ENCOUNTER — Telehealth: Payer: Self-pay | Admitting: Family Medicine

## 2013-08-23 DIAGNOSIS — R251 Tremor, unspecified: Secondary | ICD-10-CM

## 2013-08-23 DIAGNOSIS — F988 Other specified behavioral and emotional disorders with onset usually occurring in childhood and adolescence: Secondary | ICD-10-CM

## 2013-08-23 MED ORDER — ALPRAZOLAM 0.25 MG PO TABS: 0.2500 mg | ORAL_TABLET | Freq: Three times a day (TID) | ORAL | Status: DC | PRN

## 2013-08-23 MED ORDER — AMPHETAMINE-DEXTROAMPHETAMINE 10 MG PO TABS: ORAL_TABLET | ORAL | Status: DC

## 2013-08-23 NOTE — Telephone Encounter (Signed)
Pt called triage line again today requesting refills on Xanax and Adderall Last OV 03-29-13 Meds last filled 05-25-13 #90 with 0 refills.   Low Risk

## 2013-08-23 NOTE — Telephone Encounter (Signed)
Refill x1 each 

## 2013-09-20 ENCOUNTER — Other Ambulatory Visit: Payer: Self-pay | Admitting: Family Medicine

## 2013-09-20 DIAGNOSIS — F988 Other specified behavioral and emotional disorders with onset usually occurring in childhood and adolescence: Secondary | ICD-10-CM

## 2013-09-20 MED ORDER — AMPHETAMINE-DEXTROAMPHETAMINE 10 MG PO TABS: ORAL_TABLET | ORAL | Status: DC

## 2013-10-25 ENCOUNTER — Ambulatory Visit (INDEPENDENT_AMBULATORY_CARE_PROVIDER_SITE_OTHER): Payer: BC Managed Care – PPO | Admitting: Family Medicine

## 2013-10-25 ENCOUNTER — Encounter: Payer: Self-pay | Admitting: Family Medicine

## 2013-10-25 ENCOUNTER — Other Ambulatory Visit: Payer: Self-pay

## 2013-10-25 VITALS — BP 124/78 | HR 110 | Temp 98.4°F | Wt 138.8 lb

## 2013-10-25 DIAGNOSIS — F988 Other specified behavioral and emotional disorders with onset usually occurring in childhood and adolescence: Secondary | ICD-10-CM

## 2013-10-25 DIAGNOSIS — R259 Unspecified abnormal involuntary movements: Secondary | ICD-10-CM

## 2013-10-25 DIAGNOSIS — R251 Tremor, unspecified: Secondary | ICD-10-CM

## 2013-10-25 MED ORDER — AMPHETAMINE-DEXTROAMPHETAMINE 10 MG PO TABS: ORAL_TABLET | ORAL | Status: DC

## 2013-10-25 MED ORDER — AMPHETAMINE-DEXTROAMPHETAMINE 10 MG PO TABS: 10.0000 mg | ORAL_TABLET | Freq: Two times a day (BID) | ORAL | Status: DC

## 2013-10-25 MED ORDER — ALPRAZOLAM 0.25 MG PO TABS: 0.2500 mg | ORAL_TABLET | Freq: Three times a day (TID) | ORAL | Status: DC | PRN

## 2013-10-25 NOTE — Progress Notes (Signed)
  Subjective:    Patient ID: Destiny Rangel, female    DOB: 08/07/1981, 32 y.o.   MRN: [id number]  HPI Pt here to f/u add and anxiety meds.  Pt with no complaints.  meds working well.   Review of Systems As above     Objective:   Physical Exam  BP 124/78  Pulse 110  Temp(Src) 98.4 F (36.9 C) (Oral)  Wt 138 lb 12.8 oz (62.959 kg)  SpO2 98% General appearance: alert, cooperative, appears stated age and no distress Ears: normal TM's and external ear canals both ears Nose: Nares normal. Septum midline. Mucosa normal. No drainage or sinus tenderness. Throat: lips, mucosa, and tongue normal; teeth and gums normal Neck: no adenopathy, no carotid bruit, no JVD, supple, symmetrical, trachea midline and thyroid not enlarged, symmetric, no tenderness/mass/nodules Lungs: clear to auscultation bilaterally Heart: S1, S2 normal Neurologic: Alert and oriented X 3, normal strength and tone. Normal symmetric reflexes. Normal coordination and gait      Assessment & Plan:

## 2013-10-25 NOTE — Patient Instructions (Signed)
Attention Deficit Hyperactivity Disorder Attention deficit hyperactivity disorder (ADHD) is a problem with behavior issues based on the way the brain functions (neurobehavioral disorder). It is a common reason for behavior and academic problems in school. CAUSES  The cause of ADHD is unknown in most cases. It may run in families. It sometimes can be associated with learning disabilities and other behavioral problems. SYMPTOMS  There are 3 types of ADHD. The 3 types and some of the symptoms include:  Inattentive  Gets bored or distracted easily.  Loses or forgets things. Forgets to hand in homework.  Has trouble organizing or completing tasks.  Difficulty staying on task.  An inability to organize daily tasks and school work.  Leaving projects, chores, or homework unfinished.  Trouble paying attention or responding to details. Careless mistakes.  Difficulty following directions. Often seems like is not listening.  Dislikes activities that require sustained attention (like chores or homework).  Hyperactive-impulsive  Feels like it is impossible to sit still or stay in a seat. Fidgeting with hands and feet.  Trouble waiting turn.  Talking too much or out of turn. Interruptive.  Speaks or acts impulsively.  Aggressive, disruptive behavior.  Constantly busy or on the go, noisy.  Combined  Has symptoms of both of the above. Often children with ADHD feel discouraged about themselves and with school. They often perform well below their abilities in school. These symptoms can cause problems in home, school, and in relationships with peers. As children get older, the excess motor activities can calm down, but the problems with paying attention and staying organized persist. Most children do not outgrow ADHD but with good treatment can learn to cope with the symptoms. DIAGNOSIS  When ADHD is suspected, the diagnosis should be made by professionals trained in ADHD.  Diagnosis will  include:  Ruling out other reasons for the child's behavior.  The caregivers will check with the child's school and check their medical records.  They will talk to teachers and parents.  Behavior rating scales for the child will be filled out by those dealing with the child on a daily basis. A diagnosis is made only after all information has been considered. TREATMENT  Treatment usually includes behavioral treatment often along with medicines. It may include stimulant medicines. The stimulant medicines decrease impulsivity and hyperactivity and increase attention. Other medicines used include antidepressants and certain blood pressure medicines. Most experts agree that treatment for ADHD should address all aspects of the child's functioning. Treatment should not be limited to the use of medicines alone. Treatment should include structured classroom management. The parents must receive education to address rewarding good behavior, discipline, and limit-setting. Tutoring or behavioral therapy or both should be available for the child. If untreated, the disorder can have long-term serious effects into adolescence and adulthood. HOME CARE INSTRUCTIONS   Often with ADHD there is a lot of frustration among the family in dealing with the illness. There is often blame and anger that is not warranted. This is a life long illness. There is no way to prevent ADHD. In many cases, because the problem affects the family as a whole, the entire family may need help. A therapist can help the family find better ways to handle the disruptive behaviors and promote change. If the child is young, most of the therapist's work is with the parents. Parents will learn techniques for coping with and improving their child's behavior. Sometimes only the child with the ADHD needs counseling. Your caregivers can help   you make these decisions.  Children with ADHD may need help in organizing. Some helpful tips include:  Keep  routines the same every day from wake-up time to bedtime. Schedule everything. This includes homework and playtime. This should include outdoor and indoor recreation. Keep the schedule on the refrigerator or a bulletin board where it is frequently seen. Mark schedule changes as far in advance as possible.  Have a place for everything and keep everything in its place. This includes clothing, backpacks, and school supplies.  Encourage writing down assignments and bringing home needed books.  Offer your child a well-balanced diet. Breakfast is especially important for school performance. Children should avoid drinks with caffeine including:  Soft drinks.  Coffee.  Tea.  However, some older children (adolescents) may find these drinks helpful in improving their attention.  Children with ADHD need consistent rules that they can understand and follow. If rules are followed, give small rewards. Children with ADHD often receive, and expect, criticism. Look for good behavior and praise it. Set realistic goals. Give clear instructions. Look for activities that can foster success and self-esteem. Make time for pleasant activities with your child. Give lots of affection.  Parents are their children's greatest advocates. Learn as much as possible about ADHD. This helps you become a stronger and better advocate for your child. It also helps you educate your child's teachers and instructors if they feel inadequate in these areas. Parent support groups are often helpful. A national group with local chapters is called CHADD (Children and Adults with Attention Deficit Hyperactivity Disorder). PROGNOSIS  There is no cure for ADHD. Children with the disorder seldom outgrow it. Many find adaptive ways to accommodate the ADHD as they mature. SEEK MEDICAL CARE IF:  Your child has repeated muscle twitches, cough or speech outbursts.  Your child has sleep problems.  Your child has a marked loss of  appetite.  Your child develops depression.  Your child has new or worsening behavioral problems.  Your child develops dizziness.  Your child has a racing heart.  Your child has stomach pains.  Your child develops headaches. Document Released: 11/26/2002 Document Revised: 02/28/2012 Document Reviewed: 06/27/2013 Exit[employer] Patient Information 2014 [employer].  

## 2013-10-25 NOTE — Assessment & Plan Note (Signed)
Refill meds

## 2013-10-25 NOTE — Assessment & Plan Note (Signed)
Refill meds rto 6 months 

## 2013-10-26 ENCOUNTER — Other Ambulatory Visit: Payer: Self-pay | Admitting: *Deleted

## 2013-10-26 DIAGNOSIS — F988 Other specified behavioral and emotional disorders with onset usually occurring in childhood and adolescence: Secondary | ICD-10-CM

## 2013-10-26 MED ORDER — AMPHETAMINE-DEXTROAMPHETAMINE 10 MG PO TABS: ORAL_TABLET | ORAL | Status: DC

## 2013-10-26 NOTE — Telephone Encounter (Signed)
Pt came to office with original script that was to be filled in January. Patient noticed script has incorrect information (Take 1 tablet BID), pt actually takes 1 tab in the am and 2 tabs at noon. Correct Rx printed and given to pt.

## 2014-01-20 ENCOUNTER — Encounter: Payer: Self-pay | Admitting: Family Medicine

## 2014-01-23 ENCOUNTER — Telehealth: Payer: Self-pay | Admitting: *Deleted

## 2014-01-23 DIAGNOSIS — F988 Other specified behavioral and emotional disorders with onset usually occurring in childhood and adolescence: Secondary | ICD-10-CM

## 2014-01-23 MED ORDER — AMPHETAMINE-DEXTROAMPHETAMINE 10 MG PO TABS: ORAL_TABLET | ORAL | Status: DC

## 2014-01-23 NOTE — Telephone Encounter (Signed)
Refill x1 

## 2014-01-23 NOTE — Telephone Encounter (Signed)
amphetamine-dextroamphetamine (ADDERALL) 10 MG tablet Last refill: 12/26/12 #90, 0 refills Last OV: 10/25/13 Low risk, UDS due

## 2014-04-24 ENCOUNTER — Other Ambulatory Visit: Payer: Self-pay

## 2014-04-24 DIAGNOSIS — F988 Other specified behavioral and emotional disorders with onset usually occurring in childhood and adolescence: Secondary | ICD-10-CM

## 2014-04-24 MED ORDER — AMPHETAMINE-DEXTROAMPHETAMINE 10 MG PO TABS: ORAL_TABLET | ORAL | Status: DC

## 2014-04-24 NOTE — Telephone Encounter (Signed)
Patient called for a refill. I made her aware OV due, she will schedule when she comes to pick up, 30 day supply given.    KP

## 2014-05-21 ENCOUNTER — Ambulatory Visit (INDEPENDENT_AMBULATORY_CARE_PROVIDER_SITE_OTHER): Payer: BC Managed Care – PPO | Admitting: Family Medicine

## 2014-05-21 ENCOUNTER — Encounter: Payer: Self-pay | Admitting: Family Medicine

## 2014-05-21 VITALS — BP 134/90 | HR 103 | Temp 98.3°F | Wt 138.6 lb

## 2014-05-21 DIAGNOSIS — F988 Other specified behavioral and emotional disorders with onset usually occurring in childhood and adolescence: Secondary | ICD-10-CM

## 2014-05-21 DIAGNOSIS — R259 Unspecified abnormal involuntary movements: Secondary | ICD-10-CM

## 2014-05-21 DIAGNOSIS — R251 Tremor, unspecified: Secondary | ICD-10-CM

## 2014-05-21 DIAGNOSIS — Z23 Encounter for immunization: Secondary | ICD-10-CM

## 2014-05-21 MED ORDER — AMPHETAMINE-DEXTROAMPHETAMINE 10 MG PO TABS: ORAL_TABLET | ORAL | Status: DC

## 2014-05-21 MED ORDER — ALPRAZOLAM 0.25 MG PO TABS: 0.2500 mg | ORAL_TABLET | Freq: Three times a day (TID) | ORAL | Status: DC | PRN

## 2014-05-21 NOTE — Addendum Note (Signed)
Addended by: Verdie Shire on: 05/21/2014 02:10 PM   Modules accepted: Orders

## 2014-05-21 NOTE — Patient Instructions (Signed)
Attention Deficit Hyperactivity Disorder Attention deficit hyperactivity disorder (ADHD) is a problem with behavior issues based on the way the brain functions (neurobehavioral disorder). It is a common reason for behavior and academic problems in school. SYMPTOMS  There are 3 types of ADHD. The 3 types and some of the symptoms include:  Inattentive  Gets bored or distracted easily.  Loses or forgets things. Forgets to hand in homework.  Has trouble organizing or completing tasks.  Difficulty staying on task.  An inability to organize daily tasks and school work.  Leaving projects, chores, or homework unfinished.  Trouble paying attention or responding to details. Careless mistakes.  Difficulty following directions. Often seems like is not listening.  Dislikes activities that require sustained attention (like chores or homework).  Hyperactive-impulsive  Feels like it is impossible to sit still or stay in a seat. Fidgeting with hands and feet.  Trouble waiting turn.  Talking too much or out of turn. Interruptive.  Speaks or acts impulsively.  Aggressive, disruptive behavior.  Constantly busy or on the go, noisy.  Often leaves seat when they are expected to remain seated.  Often runs or climbs where it is not appropriate, or feels very restless.  Combined  Has symptoms of both of the above. Often children with ADHD feel discouraged about themselves and with school. They often perform well below their abilities in school. As children get older, the excess motor activities can calm down, but the problems with paying attention and staying organized persist. Most children do not outgrow ADHD but with good treatment can learn to cope with the symptoms. DIAGNOSIS  When ADHD is suspected, the diagnosis should be made by professionals trained in ADHD. This professional will collect information about the individual suspected of having ADHD. Information must be collected from  various settings where the person lives, works, or attends school.  Diagnosis will include:  Confirming symptoms began in childhood.  Ruling out other reasons for the child's behavior.  The health care providers will check with the child's school and check their medical records.  They will talk to teachers and parents.  Behavior rating scales for the child will be filled out by those dealing with the child on a daily basis. A diagnosis is made only after all information has been considered. TREATMENT  Treatment usually includes behavioral treatment, tutoring or extra support in school, and stimulant medicines. Because of the way a person's brain works with ADHD, these medicines decrease impulsivity and hyperactivity and increase attention. This is different than how they would work in a person who does not have ADHD. Other medicines used include antidepressants and certain blood pressure medicines. Most experts agree that treatment for ADHD should address all aspects of the person's functioning. Along with medicines, treatment should include structured classroom management at school. Parents should reward good behavior, provide constant discipline, and limit-setting. Tutoring should be available for the child as needed. ADHD is a life-long condition. If untreated, the disorder can have long-term serious effects into adolescence and adulthood. HOME CARE INSTRUCTIONS   Often with ADHD there is a lot of frustration among family members dealing with the condition. Blame and anger are also feelings that are common. In many cases, because the problem affects the family as a whole, the entire family may need help. A therapist can help the family find better ways to handle the disruptive behaviors of the person with ADHD and promote change. If the person with ADHD is young, most of the therapist's work   is with the parents. Parents will learn techniques for coping with and improving their child's  behavior. Sometimes only the child with the ADHD needs counseling. Your health care providers can help you make these decisions.  Children with ADHD may need help learning how to organize. Some helpful tips include:  Keep routines the same every day from wake-up time to bedtime. Schedule all activities, including homework and playtime. Keep the schedule in a place where the person with ADHD will often see it. Mark schedule changes as far in advance as possible.  Schedule outdoor and indoor recreation.  Have a place for everything and keep everything in its place. This includes clothing, backpacks, and school supplies.  Encourage writing down assignments and bringing home needed books. Work with your child's teachers for assistance in organizing school work.  Offer your child a well-balanced diet. Breakfast that includes a balance of whole grains, protein and, fruits or vegetables is especially important for school performance. Children should avoid drinks with caffeine including:  Soft drinks.  Coffee.  Tea.  However, some older children (adolescents) may find these drinks helpful in improving their attention. Because it can also be common for adolescents with ADHD to become addicted to caffeine, talk with your health care provider about what is a safe amount of caffeine intake for your child.  Children with ADHD need consistent rules that they can understand and follow. If rules are followed, give small rewards. Children with ADHD often receive, and expect, criticism. Look for good behavior and praise it. Set realistic goals. Give clear instructions. Look for activities that can foster success and self-esteem. Make time for pleasant activities with your child. Give lots of affection.  Parents are their children's greatest advocates. Learn as much as possible about ADHD. This helps you become a stronger and better advocate for your child. It also helps you educate your child's teachers and  instructors if they feel inadequate in these areas. Parent support groups are often helpful. A national group with local chapters is called Children and Adults with Attention Deficit Hyperactivity Disorder (CHADD). SEEK MEDICAL CARE IF:  Your child has repeated muscle twitches, cough or speech outbursts.  Your child has sleep problems.  Your child has a marked loss of appetite.  Your child develops depression.  Your child has new or worsening behavioral problems.  Your child develops dizziness.  Your child has a racing heart.  Your child has stomach pains.  Your child develops headaches. SEEK IMMEDIATE MEDICAL CARE IF:  Your child has been diagnosed with depression or anxiety and the symptoms seem to be getting worse.  Your child has been depressed and suddenly appears to have increased energy or motivation.  You are worried that your child is having a bad reaction to a medication he or she is taking for ADHD. Document Released: 11/26/2002 Document Revised: 09/26/2013 Document Reviewed: 08/13/2013 ExitCare Patient Information 2014 [employer].  

## 2014-05-21 NOTE — Progress Notes (Signed)
   Subjective:    Patient ID: Destiny Rangel, female    DOB: 05/03/1981, 33 y.o.   MRN: [id number]  HPI Pt here to f/u ADD and tremor.  She needs refills of her meds.   Review of Systems As above    Objective:   Physical Exam  BP 134/90  Pulse 103  Temp(Src) 98.3 F (36.8 C) (Oral)  Wt 138 lb 9.6 oz (62.869 kg)  SpO2 98%  LMP 04/20/2014 General appearance: alert, cooperative, appears stated age and no distress Lungs: clear to auscultation bilaterally Heart: S1, S2 normal Extremities: extremities normal, atraumatic, no cyanosis or edema      Assessment & Plan:  1. ADD (attention deficit disorder) Refill meds,  rto 6 months - amphetamine-dextroamphetamine (ADDERALL) 10 MG tablet; 1 po qam and 2 po in afternoon  Dispense: 90 tablet; Refill: 0 - amphetamine-dextroamphetamine (ADDERALL) 10 MG tablet; 1 tablet in the am and 2 tablets in the afternoon.  Dispense: 90 tablet; Refill: 0 - amphetamine-dextroamphetamine (ADDERALL) 10 MG tablet; Take 1 tablet in the morning and 2 tablets at noon  Dispense: 90 tablet; Refill: 0  2. Tremor Refill meds - ALPRAZolam (XANAX) 0.25 MG tablet; Take 1 tablet (0.25 mg total) by mouth 3 (three) times daily as needed for sleep.  Dispense: 30 tablet; Refill: 0

## 2014-05-21 NOTE — Progress Notes (Signed)
Pre visit review using our clinic review tool, if applicable. No additional management support is needed unless otherwise documented below in the visit note. 

## 2014-08-12 ENCOUNTER — Other Ambulatory Visit: Payer: Self-pay | Admitting: Family Medicine

## 2014-08-12 DIAGNOSIS — R251 Tremor, unspecified: Secondary | ICD-10-CM

## 2014-08-12 MED ORDER — ALPRAZOLAM 0.25 MG PO TABS: 0.2500 mg | ORAL_TABLET | Freq: Three times a day (TID) | ORAL | Status: DC | PRN

## 2014-08-12 NOTE — Telephone Encounter (Signed)
Last seen and filled 05/21/14 #30. Please advise     KP

## 2014-08-19 ENCOUNTER — Other Ambulatory Visit: Payer: Self-pay | Admitting: Family Medicine

## 2014-08-19 DIAGNOSIS — F988 Other specified behavioral and emotional disorders with onset usually occurring in childhood and adolescence: Secondary | ICD-10-CM

## 2014-08-19 MED ORDER — AMPHETAMINE-DEXTROAMPHETAMINE 10 MG PO TABS
ORAL_TABLET | ORAL | Status: DC
Start: 2014-08-19 — End: 2014-11-18

## 2014-08-19 MED ORDER — AMPHETAMINE-DEXTROAMPHETAMINE 10 MG PO TABS: ORAL_TABLET | ORAL | Status: DC

## 2014-08-19 MED ORDER — AMPHETAMINE-DEXTROAMPHETAMINE 10 MG PO TABS
ORAL_TABLET | ORAL | Status: DC
Start: 2014-08-19 — End: 2014-08-21

## 2014-08-21 ENCOUNTER — Encounter: Payer: Self-pay | Admitting: Women's Health

## 2014-08-21 ENCOUNTER — Ambulatory Visit (INDEPENDENT_AMBULATORY_CARE_PROVIDER_SITE_OTHER): Payer: BC Managed Care – PPO | Admitting: Women's Health

## 2014-08-21 VITALS — BP 136/86 | Ht 66.0 in | Wt 144.0 lb

## 2014-08-21 DIAGNOSIS — Z01419 Encounter for gynecological examination (general) (routine) without abnormal findings: Secondary | ICD-10-CM

## 2014-08-21 DIAGNOSIS — Z3041 Encounter for surveillance of contraceptive pills: Secondary | ICD-10-CM

## 2014-08-21 MED ORDER — NORETHIN ACE-ETH ESTRAD-FE 1-20 MG-MCG PO TABS: 1.0000 | ORAL_TABLET | Freq: Every day | ORAL | Status: DC

## 2014-08-21 NOTE — Patient Instructions (Signed)

## 2014-08-21 NOTE — Progress Notes (Signed)
Destiny Rangel 07-19-1981 [id number]    History:    Presents for annual exam.  Monthly cycle on Loestrin, not sexually active. Had lost 50 pounds with diet and exercise in 2014 has regained 10 pounds this year. Normal Pap history. Gardasil series completed. Labs at work - cholesterol 213, HDL 90 ratio 2.3. Glucose 81. Blood pressure 122/64.  Past medical history, past surgical history, family history and social history were all reviewed and documented in the EPIC chart. Works for [employer], [employer]. Parents healthy.  ROS:  A  12 point ROS was performed and pertinent positives and negatives are included.  Exam:  Filed Vitals:   08/21/14 1036  BP: 136/86    General appearance:  Normal Thyroid:  Symmetrical, normal in size, without palpable masses or nodularity. Respiratory  Auscultation:  Clear without wheezing or rhonchi Cardiovascular  Auscultation:  Regular rate, without rubs, murmurs or gallops  Edema/varicosities:  Not grossly evident Abdominal  Soft,nontender, without masses, guarding or rebound.  Liver/spleen:  No organomegaly noted  Hernia:  None appreciated  Skin  Inspection:  Grossly normal   Breasts: Examined lying and sitting.     Right: Without masses, retractions, discharge or axillary adenopathy.     Left: Without masses, retractions, discharge or axillary adenopathy. Gentitourinary   Inguinal/mons:  Normal without inguinal adenopathy  External genitalia:  Normal  BUS/Urethra/Skene's glands:  Normal  Vagina:  Normal  Cervix:  Normal  Uterus:   normal in size, shape and contour.  Midline and mobile  Adnexa/parametria:     Rt: Without masses or tenderness.   Lt: Without masses or tenderness.  Anus and perineum: Normal  Digital rectal exam: Normal sphincter tone without palpated masses or tenderness  Assessment/Plan:  33 y.o. SWF G0 for annual exam with no complaints.  Normal GYN exam on Loestrin  Plan: Loestrin 1/20 prescription, proper use given and reviewed slight  risk for blood clots and strokes. Condoms encouraged until permanent partner. SBE's, regular exercise, calcium rich diet, MVI daily encouraged. UA, Pap normal 2014, new screening guidelines reviewed. B/P slightly elevated today,  normal at health screening will watch and check away from office.  Note: This dictation was prepared with Dragon/digital dictation.  Any transcriptional errors that result are unintentional. Harrington Challenger [facility], 10:58 AM 08/21/2014

## 2014-08-22 LAB — URINALYSIS W MICROSCOPIC + REFLEX CULTURE
Bacteria, UA: NONE SEEN
Bilirubin Urine: NEGATIVE
CRYSTALS: NONE SEEN
Casts: NONE SEEN
GLUCOSE, UA: NEGATIVE mg/dL
Ketones, ur: NEGATIVE mg/dL
LEUKOCYTES UA: NEGATIVE
Nitrite: NEGATIVE
PROTEIN: NEGATIVE mg/dL
SQUAMOUS EPITHELIAL / LPF: NONE SEEN
Urobilinogen, UA: 0.2 mg/dL (ref 0.0–1.0)
pH: 6.5 (ref 5.0–8.0)

## 2014-08-23 ENCOUNTER — Other Ambulatory Visit: Payer: Self-pay

## 2014-08-23 DIAGNOSIS — Z3041 Encounter for surveillance of contraceptive pills: Secondary | ICD-10-CM

## 2014-08-23 MED ORDER — NORETHIN ACE-ETH ESTRAD-FE 1-20 MG-MCG PO TABS
1.0000 | ORAL_TABLET | Freq: Every day | ORAL | Status: DC
Start: 2014-08-23 — End: 2015-09-04

## 2014-10-01 ENCOUNTER — Encounter: Payer: Self-pay | Admitting: Family Medicine

## 2014-11-18 ENCOUNTER — Other Ambulatory Visit: Payer: Self-pay | Admitting: Family Medicine

## 2014-11-18 DIAGNOSIS — F988 Other specified behavioral and emotional disorders with onset usually occurring in childhood and adolescence: Secondary | ICD-10-CM

## 2014-11-18 MED ORDER — AMPHETAMINE-DEXTROAMPHETAMINE 10 MG PO TABS: ORAL_TABLET | ORAL | Status: DC

## 2014-11-18 NOTE — Telephone Encounter (Signed)
Last seen 05/21/14 and filled 08/19/14.  UDS 03/29/13 low risk  Please advise    KP

## 2014-11-18 NOTE — Telephone Encounter (Signed)
Patient called in requesting refill on Adderall, call when ready for pick up

## 2014-12-26 ENCOUNTER — Ambulatory Visit: Payer: Self-pay | Admitting: Family Medicine

## 2014-12-27 ENCOUNTER — Ambulatory Visit (INDEPENDENT_AMBULATORY_CARE_PROVIDER_SITE_OTHER): Payer: BLUE CROSS/BLUE SHIELD | Admitting: Family Medicine

## 2014-12-27 ENCOUNTER — Encounter: Payer: Self-pay | Admitting: Family Medicine

## 2014-12-27 VITALS — BP 150/86 | HR 107 | Temp 98.7°F | Resp 20 | Wt 137.2 lb

## 2014-12-27 DIAGNOSIS — F988 Other specified behavioral and emotional disorders with onset usually occurring in childhood and adolescence: Secondary | ICD-10-CM

## 2014-12-27 DIAGNOSIS — F909 Attention-deficit hyperactivity disorder, unspecified type: Secondary | ICD-10-CM

## 2014-12-27 MED ORDER — AMPHETAMINE-DEXTROAMPHETAMINE 10 MG PO TABS: ORAL_TABLET | ORAL | Status: DC

## 2014-12-27 NOTE — Progress Notes (Signed)
Pre visit review using our clinic review tool, if applicable. No additional management support is needed unless otherwise documented below in the visit note. 

## 2014-12-27 NOTE — Patient Instructions (Signed)

## 2014-12-27 NOTE — Progress Notes (Signed)
   Subjective:    Patient ID: Destiny Rangel, female    DOB: 1981/05/12, 34 y.o.   MRN: [id number]  HPI Pt is here to f/u ADD.  She is doing well with the meds but is under a lot of stress with work etc.  Pt thinks this is why pulse and bp are elevated.    No other complaints.     Review of Systems  Constitutional: Negative for fever, chills, diaphoresis, activity change, appetite change, fatigue and unexpected weight change.  Psychiatric/Behavioral: Negative for suicidal ideas, hallucinations, behavioral problems, confusion, sleep disturbance, self-injury, dysphoric mood, decreased concentration and agitation. The patient is not nervous/anxious and is not hyperactive.        Objective:   Physical Exam  BP 150/86 mmHg  Pulse 107  Temp(Src) 98.7 F (37.1 C) (Oral)  Resp 20  Wt 137 lb 3.2 oz (62.234 kg)  SpO2 97% General appearance: alert, cooperative, appears stated age and no distress Head: Normocephalic, without obvious abnormality, atraumatic Throat: lips, mucosa, and tongue normal; teeth and gums normal Neck: no adenopathy, supple, symmetrical, trachea midline and thyroid not enlarged, symmetric, no tenderness/mass/nodules Lungs: clear to auscultation bilaterally Heart: regular rate and rhythm, S1, S2 normal, no murmur, click, rub or gallop Extremities: extremities normal, atraumatic, no cyanosis or edema      Assessment & Plan:  1. ADD (attention deficit disorder) rto 6 months - amphetamine-dextroamphetamine (ADDERALL) 10 MG tablet; 1 po qam and 2 po in afternoon  Dispense: 90 tablet; Refill: 0 - amphetamine-dextroamphetamine (ADDERALL) 10 MG tablet; 1 po qam and 2 po in afternoon  Dispense: 90 tablet; Refill: 0 - amphetamine-dextroamphetamine (ADDERALL) 10 MG tablet; 1 po qam and 2 po q afternoon  Dispense: 90 tablet; Refill: 0

## 2014-12-30 ENCOUNTER — Telehealth: Payer: Self-pay | Admitting: Family Medicine

## 2014-12-30 NOTE — Telephone Encounter (Signed)
emmi emailed °

## 2015-02-26 ENCOUNTER — Other Ambulatory Visit: Payer: Self-pay | Admitting: Family Medicine

## 2015-02-26 DIAGNOSIS — R251 Tremor, unspecified: Secondary | ICD-10-CM

## 2015-02-28 MED ORDER — ALPRAZOLAM 0.25 MG PO TABS: 0.2500 mg | ORAL_TABLET | Freq: Three times a day (TID) | ORAL | Status: DC | PRN

## 2015-02-28 NOTE — Telephone Encounter (Signed)
Faxed to [facility].  

## 2015-02-28 NOTE — Telephone Encounter (Signed)
Requesting Alprazolam 0.25mg -Take 1 tablet by mouth 3 times daily as needed for sleep. Last refill:08-12-14;#30,0 Last OV:12-27-14 UDS:03-29-13-Low risk-Due Please advise.//AB/CMA

## 2015-03-24 ENCOUNTER — Telehealth: Payer: Self-pay | Admitting: Family Medicine

## 2015-03-24 DIAGNOSIS — F988 Other specified behavioral and emotional disorders with onset usually occurring in childhood and adolescence: Secondary | ICD-10-CM

## 2015-03-24 MED ORDER — AMPHETAMINE-DEXTROAMPHETAMINE 10 MG PO TABS: ORAL_TABLET | ORAL | Status: DC

## 2015-03-24 NOTE — Telephone Encounter (Signed)
Caller name::Maestas, Laurieann Relation to ZO:XWRUpt:self Call back number:[phone] Pharmacy:  Reason for call: pt is needing rx amphetamine-dextroamphetamine (ADDERALL) 10 MG tablet please call when available and ready for pick up

## 2015-03-24 NOTE — Telephone Encounter (Signed)
UDS ordered. Patient aware Rx ready for pick up.     KP

## 2015-06-27 ENCOUNTER — Telehealth: Payer: Self-pay | Admitting: Family Medicine

## 2015-06-27 DIAGNOSIS — F988 Other specified behavioral and emotional disorders with onset usually occurring in childhood and adolescence: Secondary | ICD-10-CM

## 2015-06-27 MED ORDER — AMPHETAMINE-DEXTROAMPHETAMINE 10 MG PO TABS: ORAL_TABLET | ORAL | Status: DC

## 2015-06-27 NOTE — Telephone Encounter (Signed)
I tried calling [phone] (H) twice but the number was disconnected. Rx left at check in.      KP    Called the alternative number and the patient is aware Rx ready for pick up.        KP

## 2015-06-27 NOTE — Telephone Encounter (Signed)
Relation to pt: self  Call back number: [phone]   Reason for call:  Pt requesting a refill amphetamine-dextroamphetamine (ADDERALL) 10 MG tablet

## 2015-07-25 ENCOUNTER — Telehealth: Payer: Self-pay | Admitting: Family Medicine

## 2015-07-25 DIAGNOSIS — F988 Other specified behavioral and emotional disorders with onset usually occurring in childhood and adolescence: Secondary | ICD-10-CM

## 2015-07-25 MED ORDER — AMPHETAMINE-DEXTROAMPHETAMINE 10 MG PO TABS: ORAL_TABLET | ORAL | Status: DC

## 2015-07-25 NOTE — Telephone Encounter (Signed)
VM left advising Rx ready for pick up.     KP 

## 2015-07-25 NOTE — Telephone Encounter (Signed)
Caller name::Halk Anavey Relation to ZO:XWRU Call back number:[phone] Pharmacy:  Reason for call: pt is needing rx amphetamine-dextroamphetamine (ADDERALL) 10 MG tablet please call when available for pick up

## 2015-08-19 ENCOUNTER — Encounter: Payer: Self-pay | Admitting: Family Medicine

## 2015-08-19 ENCOUNTER — Ambulatory Visit (INDEPENDENT_AMBULATORY_CARE_PROVIDER_SITE_OTHER): Payer: BLUE CROSS/BLUE SHIELD | Admitting: Family Medicine

## 2015-08-19 VITALS — BP 144/100 | HR 96 | Temp 98.7°F | Ht 66.0 in | Wt 128.2 lb

## 2015-08-19 DIAGNOSIS — F909 Attention-deficit hyperactivity disorder, unspecified type: Secondary | ICD-10-CM

## 2015-08-19 DIAGNOSIS — F988 Other specified behavioral and emotional disorders with onset usually occurring in childhood and adolescence: Secondary | ICD-10-CM

## 2015-08-19 MED ORDER — AMPHETAMINE-DEXTROAMPHETAMINE 10 MG PO TABS: ORAL_TABLET | ORAL | Status: DC

## 2015-08-19 NOTE — Progress Notes (Signed)
Patient ID: Destiny Rangel, female   DOB: August 25, 1981, 34 y.o.   MRN: [id number]   Subjective:    Patient ID: Destiny Rangel, female    DOB: [birth year], 34 y.o.   MRN: [id number]  No chief complaint on file.   HPI Patient is in today for f/u ADHD.  No complaints.   She does need refills.   bp at work pe  118/74  Past Medical History  Diagnosis Date  . Allergy   . ADHD (attention deficit hyperactivity disorder)     No past surgical history on file.  Family History  Problem Relation Age of Onset  . Alcohol abuse    . Depression    . Cancer Mother     skin  . Cancer Father     skin  . Parkinsonism Maternal Grandmother     dementia  . Cancer Maternal Grandfather     Social History   Social History  . Marital Status: Single    Spouse Name: N/A  . Number of Children: N/A  . Years of Education: N/A   Occupational History  . Not on file.   Social History Main Topics  . Smoking status: Current Some Day Smoker  . Smokeless tobacco: Never Used  . Alcohol Use: Yes     Comment: socially  . Drug Use: No  . Sexual Activity:    Partners: Male    Birth Control/ Protection: Pill   Other Topics Concern  . Not on file   Social History Narrative    Outpatient Prescriptions Prior to Visit  Medication Sig Dispense Refill  . ALPRAZolam (XANAX) 0.25 MG tablet Take 1 tablet (0.25 mg total) by mouth 3 (three) times daily as needed for sleep. 30 tablet 0  . norethindrone-ethinyl estradiol (JUNEL FE,GILDESS FE,LOESTRIN FE) 1-20 MG-MCG tablet Take 1 tablet by mouth daily. 3 Package 4  . amphetamine-dextroamphetamine (ADDERALL) 10 MG tablet 1 po qam and 2 po in afternoon 90 tablet 0   No facility-administered medications prior to visit.    No Known Allergies  Review of Systems  Constitutional: Negative for fever and malaise/fatigue.  HENT: Negative for congestion.   Eyes: Negative for discharge.  Respiratory: Negative for shortness of breath.   Cardiovascular: Negative for chest  pain, palpitations and leg swelling.  Gastrointestinal: Negative for nausea and abdominal pain.  Genitourinary: Negative for dysuria.  Musculoskeletal: Negative for falls.  Skin: Negative for rash.  Neurological: Negative for loss of consciousness and headaches.  Endo/Heme/Allergies: Negative for environmental allergies.  Psychiatric/Behavioral: Negative for depression. The patient is not nervous/anxious.        Objective:    Physical Exam  Constitutional: She is oriented to person, place, and time. She appears well-developed and well-nourished.  HENT:  Head: Normocephalic and atraumatic.  Eyes: Conjunctivae and EOM are normal.  Neck: Normal range of motion. Neck supple. No JVD present. Carotid bruit is not present. No thyromegaly present.  Cardiovascular: Normal rate, regular rhythm and normal heart sounds.   No murmur heard. Pulmonary/Chest: Effort normal and breath sounds normal. No respiratory distress. She has no wheezes. She has no rales. She exhibits no tenderness.  Musculoskeletal: She exhibits no edema.  Neurological: She is alert and oriented to person, place, and time.  Psychiatric: She has a normal mood and affect.    Pulse 96  Temp(Src) 98.7 F (37.1 C) (Oral)  Ht  (1.676 m)  Wt 128 lb 3.2 oz (58.151 kg)  BMI 20.70 kg/m2  SpO2 100%  LMP  08/12/2015 Wt Readings from Last 3 Encounters:  08/19/15 128 lb 3.2 oz (58.151 kg)  12/27/14 137 lb 3.2 oz (62.234 kg)  08/21/14 144 lb (65.318 kg)     Lab Results  Component Value Date   WBC 6.2 03/29/2013   HGB 13.5 03/29/2013   HCT 39.8 03/29/2013   PLT 229.0 03/29/2013   GLUCOSE 82 03/29/2013   CHOL 177 07/19/2012   TRIG 95 07/19/2012   HDL 58 07/19/2012   LDLCALC 100* 07/19/2012   ALT 19 03/29/2013   AST 20 03/29/2013   NA 135 03/29/2013   K 3.8 03/29/2013   CL 101 03/29/2013   CREATININE 0.5 03/29/2013   BUN 9 03/29/2013   CO2 25 03/29/2013   TSH 1.27 03/29/2013    Lab Results  Component Value  Date   TSH 1.27 03/29/2013   Lab Results  Component Value Date   WBC 6.2 03/29/2013   HGB 13.5 03/29/2013   HCT 39.8 03/29/2013   MCV 94.6 03/29/2013   PLT 229.0 03/29/2013   Lab Results  Component Value Date   NA 135 03/29/2013   K 3.8 03/29/2013   CO2 25 03/29/2013   GLUCOSE 82 03/29/2013   BUN 9 03/29/2013   CREATININE 0.5 03/29/2013   BILITOT 0.4 03/29/2013   ALKPHOS 35* 03/29/2013   AST 20 03/29/2013   ALT 19 03/29/2013   PROT 7.6 03/29/2013   ALBUMIN 4.3 03/29/2013   CALCIUM 9.1 03/29/2013   GFR 162.42 03/29/2013   Lab Results  Component Value Date   CHOL 177 07/19/2012   Lab Results  Component Value Date   HDL 58 07/19/2012   Lab Results  Component Value Date   LDLCALC 100* 07/19/2012   Lab Results  Component Value Date   TRIG 95 07/19/2012   Lab Results  Component Value Date   CHOLHDL 3.1 07/19/2012   No results found for: HGBA1C     Assessment & Plan:   Problem List Items Addressed This Visit    None    Visit Diagnoses    ADD (attention deficit disorder)    -  Primary    Relevant Medications    amphetamine-dextroamphetamine (ADDERALL) 10 MG tablet       I am having Ms. Soroka maintain her norethindrone-ethinyl estradiol, ALPRAZolam, amphetamine-dextroamphetamine, amphetamine-dextroamphetamine, and amphetamine-dextroamphetamine.  Meds ordered this encounter  Medications  . DISCONTD: amphetamine-dextroamphetamine (ADDERALL) 10 MG tablet    Sig: 1 po qam and 2 po in afternoon    Dispense:  90 tablet    Refill:  0    Do not fill until Sept 6, 2016  . DISCONTD: amphetamine-dextroamphetamine (ADDERALL) 10 MG tablet    Sig: 1 po qam and 2 po in afternoon    Dispense:  93 tablet    Refill:  0    Do not fill until Sep 25, 2015 (there are 31 days in Oct)  . DISCONTD: amphetamine-dextroamphetamine (ADDERALL) 10 MG tablet    Sig: 1 po qam and 2 po in afternoon    Dispense:  60 tablet    Refill:  0    Do not fill until Oct 26, 2015  .  amphetamine-dextroamphetamine (ADDERALL) 10 MG tablet    Sig: 1 po qam and 2 po in afternoon    Dispense:  90 tablet    Refill:  0    Do not fill until Oct 26, 2015  . amphetamine-dextroamphetamine (ADDERALL) 10 MG tablet    Sig: 1 po qam and 2 po in  afternoon    Dispense:  90 tablet    Refill:  0    Do not fill until Sept 6, 2016  . amphetamine-dextroamphetamine (ADDERALL) 10 MG tablet    Sig: 1 po qam and 2 po in afternoon    Dispense:  90 tablet    Refill:  0    Do not fill until Sep 25, 2015 (there are 31 days in Oct)     Loreen Freud, DO

## 2015-08-19 NOTE — Patient Instructions (Signed)

## 2015-09-04 ENCOUNTER — Other Ambulatory Visit (HOSPITAL_COMMUNITY)
Admission: RE | Admit: 2015-09-04 | Discharge: 2015-09-04 | Disposition: A | Discharge status: Home / Self Care | Payer: BLUE CROSS/BLUE SHIELD | Source: Ambulatory Visit | Attending: Women's Health | Admitting: Women's Health

## 2015-09-04 ENCOUNTER — Encounter: Payer: Self-pay | Admitting: Women's Health

## 2015-09-04 ENCOUNTER — Ambulatory Visit (INDEPENDENT_AMBULATORY_CARE_PROVIDER_SITE_OTHER): Payer: BLUE CROSS/BLUE SHIELD | Admitting: Women's Health

## 2015-09-04 VITALS — BP 138/84 | Ht 66.0 in | Wt 127.0 lb

## 2015-09-04 DIAGNOSIS — Z1151 Encounter for screening for human papillomavirus (HPV): Secondary | ICD-10-CM | POA: Insufficient documentation

## 2015-09-04 DIAGNOSIS — R87810 Cervical high risk human papillomavirus (HPV) DNA test positive: Secondary | ICD-10-CM | POA: Insufficient documentation

## 2015-09-04 DIAGNOSIS — Z01419 Encounter for gynecological examination (general) (routine) without abnormal findings: Secondary | ICD-10-CM | POA: Diagnosis not present

## 2015-09-04 DIAGNOSIS — Z113 Encounter for screening for infections with a predominantly sexual mode of transmission: Secondary | ICD-10-CM

## 2015-09-04 DIAGNOSIS — Z3041 Encounter for surveillance of contraceptive pills: Secondary | ICD-10-CM | POA: Diagnosis not present

## 2015-09-04 DIAGNOSIS — Z01411 Encounter for gynecological examination (general) (routine) with abnormal findings: Secondary | ICD-10-CM | POA: Diagnosis present

## 2015-09-04 MED ORDER — NORETHIN ACE-ETH ESTRAD-FE 1-20 MG-MCG PO TABS: 1.0000 | ORAL_TABLET | Freq: Every day | ORAL | Status: DC

## 2015-09-04 NOTE — Patient Instructions (Signed)

## 2015-09-04 NOTE — Progress Notes (Signed)
Destiny Rangel 05-Apr-1981 [id number]    History:    Presents for annual exam.  Monthly cycle on Loestrin currently not sexually active. Gardasil series completed. Normal labs at work. Normal Pap history. Has lost 70 pounds with diet and exercise.   Past medical history, past surgical history, family history and social history were all reviewed and documented in the EPIC chart. Works at [employer], [employer] working on [employer]. Parents healthy.  ROS:  A ROS was performed and pertinent positives and negatives are included.  Exam:  Filed Vitals:   09/04/15 1027  BP: 138/84    General appearance:  Normal Thyroid:  Symmetrical, normal in size, without palpable masses or nodularity. Respiratory  Auscultation:  Clear without wheezing or rhonchi Cardiovascular  Auscultation:  Regular rate, without rubs, murmurs or gallops  Edema/varicosities:  Not grossly evident Abdominal  Soft,nontender, without masses, guarding or rebound.  Liver/spleen:  No organomegaly noted  Hernia:  None appreciated  Skin  Inspection:  Grossly normal   Breasts: Examined lying and sitting.     Right: Without masses, retractions, discharge or axillary adenopathy.     Left: Without masses, retractions, discharge or axillary adenopathy. Gentitourinary   Inguinal/mons:  Normal without inguinal adenopathy  External genitalia:  Normal  BUS/Urethra/Skene's glands:  Normal  Vagina:  Normal  Cervix:  Normal  Uterus:   normal in size, shape and contour.  Midline and mobile  Adnexa/parametria:     Rt: Without masses or tenderness.   Lt: Without masses or tenderness.  Anus and perineum: Normal  Digital rectal exam: Normal sphincter tone without palpated masses or tenderness  Assessment/Plan:  34 y.o. S WF G0  for annual exam with no complaints.   Normal GYN exam Contraception management STD screen Labs-work/ reports as normal ADD-primary care manages  Plan: Contraception options reviewed will continue  Loestrin 1/20 prescription, proper use given and reviewed importance of condoms until permanent partner. Rare smoking, reviewed importance of no smoking ever with use of birth control pills after age 8,  increased risk for blood clots, strokes. Agrees to not smoke. SBE's, continue active healthy lifestyle and healthy diet. Reviewed BMI now  20 no need for further weight loss. MVI daily encouraged. UA, Pap with HR HPV typing, new screening guidelines reviewed.   Harrington Challenger WHNP, 1:01 PM 09/04/2015

## 2015-09-05 LAB — GC/CHLAMYDIA PROBE AMP
CT Probe RNA: NEGATIVE
GC Probe RNA: NEGATIVE

## 2015-09-05 LAB — URINALYSIS W MICROSCOPIC + REFLEX CULTURE
Bilirubin Urine: NEGATIVE
Crystals: NONE SEEN [HPF]
GLUCOSE, UA: NEGATIVE
HGB URINE DIPSTICK: NEGATIVE
LEUKOCYTES UA: NEGATIVE
Nitrite: NEGATIVE
PH: 7 (ref 5.0–8.0)
RBC / HPF: NONE SEEN RBC/HPF (ref <=2)
SPECIFIC GRAVITY, URINE: 1.023 (ref 1.001–1.035)
Yeast: NONE SEEN [HPF]

## 2015-09-05 LAB — CYTOLOGY - PAP

## 2015-09-05 LAB — HEPATITIS B SURFACE ANTIGEN: Hepatitis B Surface Ag: NEGATIVE

## 2015-09-05 LAB — HIV ANTIBODY (ROUTINE TESTING W REFLEX): HIV: NONREACTIVE

## 2015-09-05 LAB — RPR

## 2015-09-05 LAB — HEPATITIS C ANTIBODY: HCV AB: NEGATIVE

## 2015-09-07 LAB — URINE CULTURE: Colony Count: >=100000

## 2015-09-08 ENCOUNTER — Other Ambulatory Visit: Payer: Self-pay | Admitting: Women's Health

## 2015-09-08 MED ORDER — SULFAMETHOXAZOLE-TRIMETHOPRIM 800-160 MG PO TABS: 1.0000 | ORAL_TABLET | Freq: Two times a day (BID) | ORAL | Status: DC

## 2015-10-17 ENCOUNTER — Encounter: Payer: Self-pay | Admitting: Gynecology

## 2015-10-17 ENCOUNTER — Ambulatory Visit (INDEPENDENT_AMBULATORY_CARE_PROVIDER_SITE_OTHER): Payer: BLUE CROSS/BLUE SHIELD | Admitting: Gynecology

## 2015-10-17 VITALS — BP 138/90

## 2015-10-17 DIAGNOSIS — B977 Papillomavirus as the cause of diseases classified elsewhere: Secondary | ICD-10-CM

## 2015-10-17 DIAGNOSIS — IMO0002 Reserved for concepts with insufficient information to code with codable children: Secondary | ICD-10-CM

## 2015-10-17 DIAGNOSIS — Z23 Encounter for immunization: Secondary | ICD-10-CM

## 2015-10-17 DIAGNOSIS — R8789 Other abnormal findings in specimens from female genital organs: Secondary | ICD-10-CM

## 2015-10-17 NOTE — Progress Notes (Addendum)
   Patient is a 34 year old gravida 0 who presented to the office today for further discussion of her recent abnormal Pap smear at time of her annual exam last month. Her Pap smear had demonstrated the following:  NEGATIVE FOR INTRAEPITHELIAL LESIONS OR MALIGNANCY. BENIGN REACTIVE/REPARATIVE CHANGES. Pecola LeisureJOSHUA KISH MD Pathologist, Electronic Signature (Case signed 09/08/2015) Source CervicoVaginal Pap [ThinPrep Imaged] Molecular Results Test Result HPV 16,18/45 Genotyping POSITIVE FOR HPV 16 HPV High Risk DETECTED   Patient denies any prior history of any abnormal Pap smear. Patient's last sexual contact was proximally 6 months ago. Patient denies any past history of any STDs. Patient was counseled for colposcopic evaluation today.  Colposcopic evaluation: Patient underwent a detail colposcopic evaluation. The external genitalia, perineum and perirectal region were inspected no lesions were noted. The speculum was introduced into the vagina. A systematic inspection vagina did not demonstrate any lesions on the fornices. Acetic acid was applied. The ectocervix was visualized. With the use of the endocervical speculum the transformation sewn was visualized. An acetowhite area was noted at 12 and 6:00 position which were respectively biopsied along with an ECC. Silver nitrate and Monsel solution was used for hemostasis.  Physical Exam  Genitourinary:     the above findings any areas of biopsy were shared with the patient as well as she was drawn to help explained to her the areas. Literature information was also provided.  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 Patient underwent detail colposcopic evaluation whereby acetowhite areas were noted at 12 and 6:00 position of the ectocervix which were respectively biopsied along with an ECC. Results pending at time of this dictation. The new guidelines from the ASSCP were shared with the patient as  to the diagnosis and management of abnormal Pap smear. All questions were answered we'll follow quarterly.

## 2015-10-17 NOTE — Patient Instructions (Signed)
Colposcopy  Care After  Colposcopy is a procedure in which a special tool is used to magnify the surface of the cervix. A tissue sample (biopsy) may also be taken. This sample will be looked at for cervical cancer or other problems. After the test:  · You may have some cramping.  · Lie down for a few minutes if you feel lightheaded.  ·  You may have some bleeding which should stop in a few days.  HOME CARE  · Do not have sex or use tampons for 2 to 3 days or as told.  · Only take medicine as told by your doctor.  · Continue to take your birth control pills as usual.  Finding out the results of your test  Ask when your test results will be ready. Make sure you get your test results.  GET HELP RIGHT AWAY IF:  · You are bleeding a lot or are passing blood clots.  · You develop a fever of 102° F (38.9° C) or higher.  · You have abnormal vaginal discharge.  · You have cramps that do not go away with medicine.  · You feel lightheaded, dizzy, or pass out (faint).  MAKE SURE YOU:   · Understand these instructions.  · Will watch your condition.  · Will get help right away if you are not doing well or get worse.     This information is not intended to replace advice given to you by your health care provider. Make sure you discuss any questions you have with your health care provider.     Document Released: 05/24/2008 Document Revised: 02/28/2012 Document Reviewed: 07/05/2013  Elsevier Interactive Patient Education ©2016 Elsevier Inc.

## 2015-10-24 ENCOUNTER — Encounter: Payer: Self-pay | Admitting: Gynecology

## 2015-10-24 ENCOUNTER — Telehealth: Payer: Self-pay | Admitting: *Deleted

## 2015-10-24 ENCOUNTER — Telehealth: Payer: Self-pay

## 2015-10-24 ENCOUNTER — Ambulatory Visit (INDEPENDENT_AMBULATORY_CARE_PROVIDER_SITE_OTHER): Payer: BLUE CROSS/BLUE SHIELD | Admitting: Gynecology

## 2015-10-24 VITALS — BP 136/88

## 2015-10-24 DIAGNOSIS — D069 Carcinoma in situ of cervix, unspecified: Secondary | ICD-10-CM | POA: Diagnosis not present

## 2015-10-24 DIAGNOSIS — Z01818 Encounter for other preprocedural examination: Secondary | ICD-10-CM | POA: Diagnosis not present

## 2015-10-24 MED ORDER — CLINDAMYCIN PHOSPHATE 2 % VA CREA: 1.0000 | TOPICAL_CREAM | Freq: Every day | VAGINAL | Status: DC

## 2015-10-24 MED ORDER — OXYCODONE-ACETAMINOPHEN 2.5-325 MG PO TABS: 1.0000 | ORAL_TABLET | ORAL | Status: DC | PRN

## 2015-10-24 NOTE — Telephone Encounter (Signed)
I called patient to discuss scheduling surgery. We reviewed her ins benefits and her estimated surgery prepymt due to GGA.  She was scheduled for surgery on Tuesday, Dec 13. [facility] No pre op consult was requested by Dr. Glenetta HewJF.

## 2015-10-24 NOTE — Telephone Encounter (Signed)
Pt said someone from the office called her and told her Rx for Perocet 5/325 was left at office. Pt said she will come pickup Rx on Monday, as she doesn't need it now.

## 2015-10-24 NOTE — Patient Instructions (Signed)
Cervical Dysplasia  Cervical dysplasia is a condition in which a woman has abnormal changes in the cells of her cervix. The cervix is the opening to the uterus (womb). It is located between the vagina and the uterus. Cervical dysplasia may be the first sign of cervical cancer.   With early detection, treatment, and close follow-up care, nearly all cases of cervical dysplasia can be cured. If left untreated, dysplasia may become more severe.   CAUSES   Cervical dysplasia can be caused by a human papillomavirus (HPV) infection.  RISK FACTORS   · Having had a sexually transmitted disease, such as chlamydia or a human papillomavirus (HPV) infection.    · Becoming sexually active before age 18.    · Having had more than one sexual partner.    · Not using protection during sexual intercourse, especially with new sexual partners.    · Having had cancer of the vagina or vulva.    · Having a sexual partner whose previous partner had cancer of the cervix or cervical dysplasia.    · Having a sexual partner who has or has had cancer of the penis.    · Having a weakened immune system (such as from having HIV or an organ transplant).    · Being the daughter of a woman who took diethylstilbestrol (DES) during pregnancy.    · Having a family history of cervical cancer.    · Smoking.  SIGNS AND SYMPTOMS   There are usually no symptoms. If there are symptoms, they may include:   · Abnormal vaginal discharge.    · Bleeding between periods or after intercourse.    · Bleeding during menopause.    · Pain during sexual intercourse (dyspareunia).  DIAGNOSIS   A test called a Pap test may be done. During this test, cells are taken from the cervix and then looked at under a microscope. A test in which tissue is removed from the cervix (biopsy) may also be done if the Pap test is abnormal or if the cervix looks abnormal.    TREATMENT   Treatment varies based on the severity of the cervical dysplasia. Treatment may include:  · Cryotherapy.  During cryotherapy, the abnormal cells are frozen with a steel-tip instrument.    · A procedure to remove abnormal tissue from the cervix.  · Surgery to remove abnormal tissue. This is usually done in serious cases of cervical dysplasia. Surgical options include:    A cone biopsy. This is a procedure in which the cervical canal and a portion of the center of the cervix are removed.      Hysterectomy. This is a surgery in which the uterus and cervix are removed.  HOME CARE INSTRUCTIONS   · Only take over-the-counter or prescription medicines for pain or discomfort as directed by your health care provider.    · Do not use tampons, have sexual intercourse, or douche until your health care provider says it is okay.  · Keep follow-up appointments as directed by your health care provider. Women who have been treated for cervical dysplasia should have regular pelvic exams and Pap tests. During the first year following treatment of cervical dysplasia, Pap tests should be done every 3-4 months. In the second year, they should be done every 6 months or as recommended by your health care provider.  · To prevent the condition from developing again, practice safe sex.  SEEK MEDICAL CARE IF:   You develop genital warts.    SEEK IMMEDIATE MEDICAL CARE IF:   · Your menstrual period is heavier than normal.    · You develop   bright red bleeding, especially if you have blood clots.    · You have a fever.    · You have increasing cramps or pain not relieved with medicine.    · You are light-headed, unusually weak, or have fainting spells.    · You have abnormal vaginal discharge.    · You have abdominal pain.      This information is not intended to replace advice given to you by your health care provider. Make sure you discuss any questions you have with your health care provider.     Document Released: 12/06/2005 Document Revised: 12/11/2013 Document Reviewed: 08/01/2013  Elsevier Interactive Patient Education ©2016 Elsevier Inc.

## 2015-10-24 NOTE — Progress Notes (Signed)
Destiny Rangel is an 34 y.o. female. Who presented to the office today to discuss her recent colposcopic directed biopsy that was done on 10/17/2015 as a result of her abnormal Pap smear time of her annual exam. She is also here for preoperative consultation. Patient's history is as follows: Patient is a 34 year old gravida 0 who presented to the office today for further discussion of her recent abnormal Pap smear at time of her annual exam last month. Her Pap smear had demonstrated the following:  NEGATIVE FOR INTRAEPITHELIAL LESIONS OR MALIGNANCY. BENIGN REACTIVE/REPARATIVE CHANGES. Destiny LeisureJOSHUA KISH MD Pathologist, Electronic Signature (Case signed 09/08/2015) Source CervicoVaginal Pap [ThinPrep Imaged] Molecular Results Test Result HPV 16,18/45 Genotyping POSITIVE FOR HPV 16 HPV High Risk DETECTED   Patient denies any prior history of any abnormal Pap smear. Patient's last sexual contact was proximally 6 months ago. Patient denies any past history of any STDs. Patient was counseled for colposcopic evaluation today.  Colposcopic evaluation: Patient underwent a detail colposcopic evaluation. The external genitalia, perineum and perirectal region were inspected no lesions were noted. The speculum was introduced into the vagina. A systematic inspection vagina did not demonstrate any lesions on the fornices. Acetic acid was applied. The ectocervix was visualized. With the use of the endocervical speculum the transformation sewn was visualized. An acetowhite area was noted at 12 and 6:00 position which were respectively biopsied along with an ECC. Silver nitrate and Monsel solution was used for hemostasis.  Physical Exam  Genitourinary:     the above findings any areas of biopsy were shared with the patient as well as she was drawn to help explained to her the areas. Literature information was also provided.  Her pathology report demonstrated the following: Diagnosis 1. Endocervix, curettage -  DETACHED BENIGN ENDOCERVICAL MUCOSAL FRAGMENTS. - SCANT DETACHED BENIGN SQUAMOUS CELLS PRESENT. - NO DYSPLASIA, ATYPIA OR MALIGNANCY IDENTIFIED. 2. Cervix, biopsy, 12 o'clock - TRANSFORMATION ZONE MUCOSA WITH HIGH GRADE SQUAMOUS INTRAEPITHELIAL LESION, CIN-III (SEVERE DYSPLASIA/CIS) INVOLVING ENDOCERVICAL GLANDS. 3. Cervix, biopsy, 6 o'clock - TRANSFORMATION ZONE MUCOSA WITH LOW GRADE SQUAMOUS INTRAEPITHELIAL LESION, CIN-I (MILD DYSPLASIA). - ACUTE AND CHRONIC CERVICITIS.  Pertinent Gynecological History: Menses: regular Bleeding: regular Contraception: OCP (estrogen/progesterone) DES exposure: unknown Blood transfusions: none Sexually transmitted diseases: no past history Previous GYN Procedures: no  Last mammogram: not indicated Date: not indicated  Last pap: abnormal: see above Date: see above  OB History: G0, P0   Menstrual History: Menarche age: [age]  Patient's last menstrual period was 10/10/2015.    Past Medical History  Diagnosis Date  . Allergy   . ADHD (attention deficit hyperactivity disorder)     No past surgical history on file.  Family History  Problem Relation Age of Onset  . Alcohol abuse    . Depression    . Cancer Mother     skin  . Cancer Father     skin  . Parkinsonism Maternal Grandmother     dementia  . Cancer Maternal Grandfather     Social History:  reports that she has been smoking.  She has never used smokeless tobacco. She reports that she drinks alcohol. She reports that she does not use illicit drugs.  Allergies: No Known Allergies   (Not in a hospital admission)  REVIEW OF SYSTEMS: A ROS was performed and pertinent positives and negatives are included in the history.  GENERAL: No fevers or chills. HEENT: No change in vision, no earache, sore throat or sinus congestion. NECK: No pain or stiffness. CARDIOVASCULAR:  No chest pain or pressure. No palpitations. PULMONARY: No shortness of breath, cough or wheeze. GASTROINTESTINAL: No  abdominal pain, nausea, vomiting or diarrhea, melena or bright red blood per rectum. GENITOURINARY: No urinary frequency, urgency, hesitancy or dysuria. MUSCULOSKELETAL: No joint or muscle pain, no back pain, no recent trauma. DERMATOLOGIC: No rash, no itching, no lesions. ENDOCRINE: No polyuria, polydipsia, no heat or cold intolerance. No recent change in weight. HEMATOLOGICAL: No anemia or easy bruising or bleeding. NEUROLOGIC: No headache, seizures, numbness, tingling or weakness. PSYCHIATRIC: No depression, no loss of interest in normal activity or change in sleep pattern.     Blood pressure 136/88, last menstrual period 10/10/2015.  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 ECC had been otherwise benign. The risks benefits and pros and cons of the operation were discussed with the patient to include future risk of cervical incompetence and infertility. Also risk discussed was follows:                        Patient was counseled as to the risk of surgery to include the following:  1. Infection (prohylactic antibiotics will be administered)  2. DVT/Pulmonary Embolism (prophylactic pneumo compression stockings will be used)  3.Trauma to internal organs requiring additional surgical procedure to repair any injury to     Internal organs requiring perhaps additional hospitalization days.  4.Hemmorhage requiring transfusion and blood products which carry risks such as             anaphylactic reaction, hepatitis and  AIDS  Patient had received literature information on the procedure scheduled and all her questions were answered and fully accepts all risk.      Destiny Rangel 10/24/2015, 9:51 AM

## 2015-11-07 ENCOUNTER — Encounter: Payer: Self-pay | Admitting: Gynecology

## 2015-11-11 ENCOUNTER — Telehealth: Payer: Self-pay | Admitting: *Deleted

## 2015-11-11 NOTE — Telephone Encounter (Signed)
Pt is scheduled for cold knife cervical conization on 12/02/15 cycle due that week, takes birth control pills was going to start new pack of active pills in order to skip cycle that week. I told pt this is fine

## 2015-11-19 ENCOUNTER — Encounter (HOSPITAL_COMMUNITY): Payer: Self-pay | Admitting: *Deleted

## 2015-11-24 NOTE — H&P (Signed)
Destiny Rangel is an 34 y.o. female. Who presented to the office today to discuss her recent colposcopic directed biopsy that was done on 10/17/2015 as a result of her abnormal Pap smear time of her annual exam. She is also here for preoperative consultation. Patient's history is as follows: Patient is a 34 year old gravida 0 who presented to the office today for further discussion of her recent abnormal Pap smear at time of her annual exam last month. Her Pap smear had demonstrated the following:  NEGATIVE FOR INTRAEPITHELIAL LESIONS OR MALIGNANCY. BENIGN REACTIVE/REPARATIVE CHANGES. Destiny Leisure MD Pathologist, Electronic Signature (Case signed 09/08/2015) Source CervicoVaginal Pap [ThinPrep Imaged] Molecular Results Test Result HPV 16,18/45 Genotyping POSITIVE FOR HPV 16 HPV High Risk DETECTED   Patient denies any prior history of any abnormal Pap smear. Patient's last sexual contact was proximally 6 months ago. Patient denies any past history of any STDs. Patient was counseled for colposcopic evaluation today.  Colposcopic evaluation: Patient underwent a detail colposcopic evaluation. The external genitalia, perineum and perirectal region were inspected no lesions were noted. The speculum was introduced into the vagina. A systematic inspection vagina did not demonstrate any lesions on the fornices. Acetic acid was applied. The ectocervix was visualized. With the use of the endocervical speculum the transformation sewn was visualized. An acetowhite area was noted at 12 and 6:00 position which were respectively biopsied along with an ECC. Silver nitrate and Monsel solution was used for hemostasis.  Physical Exam  Genitourinary:    the above findings any areas of biopsy were shared with the patient as well as she was drawn to help explained to her the areas. Literature information was also provided.  Her pathology report demonstrated the following: Diagnosis 1. Endocervix, curettage -  DETACHED BENIGN ENDOCERVICAL MUCOSAL FRAGMENTS. - SCANT DETACHED BENIGN SQUAMOUS CELLS PRESENT. - NO DYSPLASIA, ATYPIA OR MALIGNANCY IDENTIFIED. 2. Cervix, biopsy, 12 o'clock - TRANSFORMATION ZONE MUCOSA WITH HIGH GRADE SQUAMOUS INTRAEPITHELIAL LESION, CIN-III (SEVERE DYSPLASIA/CIS) INVOLVING ENDOCERVICAL GLANDS. 3. Cervix, biopsy, 6 o'clock - TRANSFORMATION ZONE MUCOSA WITH LOW GRADE SQUAMOUS INTRAEPITHELIAL LESION, CIN-I (MILD DYSPLASIA). - ACUTE AND CHRONIC CERVICITIS.  Pertinent Gynecological History: Menses: regular Bleeding: regular Contraception: OCP (estrogen/progesterone) DES exposure: unknown Blood transfusions: none Sexually transmitted diseases: no past history Previous GYN Procedures: no  Last mammogram: not indicated Date: not indicated  Last pap: abnormal: see above Date: see above  OB History: G0, P0  Menstrual History: Menarche age: 62  Patient's last menstrual period was 10/10/2015.    Past Medical History  Diagnosis Date  . Allergy   . ADHD (attention deficit hyperactivity disorder)     No past surgical history on file.  Family History  Problem Relation Age of Onset  . Alcohol abuse    . Depression    . Cancer Mother     skin  . Cancer Father     skin  . Parkinsonism Maternal Grandmother     dementia  . Cancer Maternal Grandfather     Social History:  reports that she has been smoking. She has never used smokeless tobacco. She reports that she drinks alcohol. She reports that she does not use illicit drugs.  Allergies: No Known Allergies   (Not in a hospital admission)  REVIEW OF SYSTEMS: A ROS was performed and pertinent positives and negatives are included in the history. GENERAL: No fevers or chills. HEENT: No change in vision, no earache, sore throat or sinus congestion. NECK: No pain or stiffness. CARDIOVASCULAR: No chest pain or pressure.  No palpitations. PULMONARY: No  shortness of breath, cough or wheeze. GASTROINTESTINAL: No abdominal pain, nausea, vomiting or diarrhea, melena or bright red blood per rectum. GENITOURINARY: No urinary frequency, urgency, hesitancy or dysuria. MUSCULOSKELETAL: No joint or muscle pain, no back pain, no recent trauma. DERMATOLOGIC: No rash, no itching, no lesions. ENDOCRINE: No polyuria, polydipsia, no heat or cold intolerance. No recent change in weight. HEMATOLOGICAL: No anemia or easy bruising or bleeding. NEUROLOGIC: No headache, seizures, numbness, tingling or weakness. PSYCHIATRIC: No depression, no loss of interest in normal activity or change in sleep pattern.     Blood pressure 136/88, last menstrual period 10/10/2015.  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 ECC had been otherwise benign. The risks benefits and pros and cons of the operation were discussed with the patient to include future risk of cervical incompetence and infertility. Also risk discussed was follows:   Patient was counseled as to the risk of surgery to include the following:  1. Infection (prohylactic antibiotics will be administered)  2. DVT/Pulmonary Embolism (prophylactic pneumo compression stockings will be used)  3.Trauma to internal organs requiring additional surgical procedure to repair any injury to   Internal organs requiring perhaps additional hospitalization days.  4.Hemmorhage requiring transfusion and blood products which carry risks such  as  anaphylactic reaction, hepatitis and AIDS  Patient had received literature information on the procedure scheduled and all her questions were answered and fully accepts all risk.

## 2015-11-26 ENCOUNTER — Other Ambulatory Visit: Payer: Self-pay

## 2015-11-26 ENCOUNTER — Telehealth: Payer: Self-pay | Admitting: Family Medicine

## 2015-11-26 MED ORDER — AMPHETAMINE-DEXTROAMPHETAMINE 10 MG PO TABS: ORAL_TABLET | ORAL | Status: DC

## 2015-11-26 NOTE — Telephone Encounter (Signed)
Patient was advised Rx was ready for pick up.  

## 2015-11-26 NOTE — Telephone Encounter (Signed)
Caller name: Self   Can be reached: [phone]  Pharmacy: [facility] - [place], [place] - [address] [phone] (Phone) [phone] (Fax)        Reason for call: Request refill on amphetamine-dextroamphetamine (ADDERALL) 10 MG tablet [[id number]][[id number]]

## 2015-11-26 NOTE — Telephone Encounter (Signed)
I refilled her adderal since her pcp is out. Verified that prior sig is for 3 tabs day. Looked at med hx and called her pharmacy. Notify pt she can pick up rx

## 2015-11-26 NOTE — Telephone Encounter (Signed)
Since request was for adderal refill was for 3 tabs a day. I called pharmacy  to confirm that was the case. Also looked at prior records and back in 2013 that appears to be manner that she got filled. Dr. Farrel ConnersLowne(my supervising md) her pcp is not in office to fill. So I will  rx.

## 2015-12-02 ENCOUNTER — Ambulatory Visit (HOSPITAL_COMMUNITY): Payer: BLUE CROSS/BLUE SHIELD | Admitting: Anesthesiology

## 2015-12-02 ENCOUNTER — Ambulatory Visit (HOSPITAL_COMMUNITY)
Admission: RE | Admit: 2015-12-02 | Discharge: 2015-12-02 | Disposition: A | Discharge status: Home / Self Care | Payer: BLUE CROSS/BLUE SHIELD | Source: Ambulatory Visit | Attending: Gynecology | Admitting: Gynecology

## 2015-12-02 ENCOUNTER — Encounter (HOSPITAL_COMMUNITY): Payer: Self-pay | Admitting: Emergency Medicine

## 2015-12-02 ENCOUNTER — Encounter (HOSPITAL_COMMUNITY)
Admission: RE | Disposition: A | Discharge status: Home / Self Care | Payer: Self-pay | Source: Ambulatory Visit | Attending: Gynecology

## 2015-12-02 DIAGNOSIS — F17200 Nicotine dependence, unspecified, uncomplicated: Secondary | ICD-10-CM | POA: Diagnosis not present

## 2015-12-02 DIAGNOSIS — D069 Carcinoma in situ of cervix, unspecified: Secondary | ICD-10-CM | POA: Diagnosis present

## 2015-12-02 HISTORY — PX: CERVICAL CONIZATION W/BX: SHX1330

## 2015-12-02 LAB — PREGNANCY, URINE: Preg Test, Ur: NEGATIVE

## 2015-12-02 LAB — CBC
HEMATOCRIT: 40.1 % (ref 36.0–46.0)
Hemoglobin: 13.8 g/dL (ref 12.0–15.0)
MCH: 31.7 pg (ref 26.0–34.0)
MCHC: 34.4 g/dL (ref 30.0–36.0)
MCV: 92 fL (ref 78.0–100.0)
Platelets: 261 10*3/uL (ref 150–400)
RBC: 4.36 MIL/uL (ref 3.87–5.11)
RDW: 12.4 % (ref 11.5–15.5)
WBC: 7.7 10*3/uL (ref 4.0–10.5)

## 2015-12-02 SURGERY — CONE BIOPSY, CERVIX
Anesthesia: General | Site: Vagina

## 2015-12-02 MED ORDER — LIDOCAINE HCL (CARDIAC) 20 MG/ML IV SOLN
INTRAVENOUS | Status: AC
  Filled 2015-12-02: qty 5

## 2015-12-02 MED ORDER — KETOROLAC TROMETHAMINE 30 MG/ML IJ SOLN
INTRAMUSCULAR | Status: DC | PRN
  Administered 2015-12-02: 30 mg via INTRAVENOUS

## 2015-12-02 MED ORDER — SCOPOLAMINE 1 MG/3DAYS TD PT72
MEDICATED_PATCH | TRANSDERMAL | Status: AC
  Administered 2015-12-02: 1.5 mg via TRANSDERMAL
  Filled 2015-12-02: qty 1

## 2015-12-02 MED ORDER — LIDOCAINE HCL 1 % IJ SOLN
INTRAMUSCULAR | Status: AC
  Filled 2015-12-02: qty 20

## 2015-12-02 MED ORDER — ONDANSETRON HCL 4 MG/2ML IJ SOLN: 4.0000 mg | Freq: Once | INTRAMUSCULAR | Status: DC | PRN

## 2015-12-02 MED ORDER — DEXAMETHASONE SODIUM PHOSPHATE 10 MG/ML IJ SOLN
INTRAMUSCULAR | Status: DC | PRN
  Administered 2015-12-02: 4 mg via INTRAVENOUS

## 2015-12-02 MED ORDER — FERRIC SUBSULFATE 259 MG/GM EX SOLN
CUTANEOUS | Status: AC
  Filled 2015-12-02: qty 8

## 2015-12-02 MED ORDER — IODINE STRONG (LUGOLS) 5 % PO SOLN
ORAL | Status: DC | PRN
  Administered 2015-12-02: 0.1 mL via ORAL

## 2015-12-02 MED ORDER — GLYCOPYRROLATE 0.2 MG/ML IJ SOLN
INTRAMUSCULAR | Status: AC
Start: 2015-12-02 — End: 2015-12-02
  Filled 2015-12-02: qty 1

## 2015-12-02 MED ORDER — MEPERIDINE HCL 25 MG/ML IJ SOLN: 6.2500 mg | INTRAMUSCULAR | Status: DC | PRN

## 2015-12-02 MED ORDER — VASOPRESSIN 20 UNIT/ML IV SOLN
INTRAVENOUS | Status: DC | PRN
  Administered 2015-12-02: 20 mL

## 2015-12-02 MED ORDER — CEFAZOLIN SODIUM-DEXTROSE 2-3 GM-% IV SOLR
INTRAVENOUS | Status: DC | PRN
  Administered 2015-12-02: 2 g via INTRAVENOUS

## 2015-12-02 MED ORDER — PROPOFOL 10 MG/ML IV BOLUS
INTRAVENOUS | Status: DC | PRN
  Administered 2015-12-02: 200 mg via INTRAVENOUS

## 2015-12-02 MED ORDER — KETOROLAC TROMETHAMINE 30 MG/ML IJ SOLN
INTRAMUSCULAR | Status: AC
  Filled 2015-12-02: qty 1

## 2015-12-02 MED ORDER — ONDANSETRON HCL 4 MG/2ML IJ SOLN
INTRAMUSCULAR | Status: AC
  Filled 2015-12-02: qty 2

## 2015-12-02 MED ORDER — VASOPRESSIN 20 UNIT/ML IV SOLN
INTRAVENOUS | Status: AC
  Filled 2015-12-02: qty 1

## 2015-12-02 MED ORDER — KETOROLAC TROMETHAMINE 30 MG/ML IJ SOLN: 30.0000 mg | Freq: Once | INTRAMUSCULAR | Status: DC | PRN

## 2015-12-02 MED ORDER — LACTATED RINGERS IV SOLN
INTRAVENOUS | Status: DC
  Administered 2015-12-02 (×2): via INTRAVENOUS

## 2015-12-02 MED ORDER — IODINE STRONG (LUGOLS) 5 % PO SOLN
ORAL | Status: AC
  Filled 2015-12-02: qty 1

## 2015-12-02 MED ORDER — FENTANYL CITRATE (PF) 100 MCG/2ML IJ SOLN
INTRAMUSCULAR | Status: AC
  Filled 2015-12-02: qty 2

## 2015-12-02 MED ORDER — LIDOCAINE HCL (CARDIAC) 20 MG/ML IV SOLN
INTRAVENOUS | Status: DC | PRN
  Administered 2015-12-02: 60 mg via INTRAVENOUS

## 2015-12-02 MED ORDER — FERRIC SUBSULFATE SOLN
Status: DC | PRN
  Administered 2015-12-02: 1

## 2015-12-02 MED ORDER — ONDANSETRON HCL 4 MG/2ML IJ SOLN
INTRAMUSCULAR | Status: DC | PRN
  Administered 2015-12-02: 4 mg via INTRAVENOUS

## 2015-12-02 MED ORDER — SCOPOLAMINE 1 MG/3DAYS TD PT72
1.0000 | MEDICATED_PATCH | Freq: Once | TRANSDERMAL | Status: DC
  Administered 2015-12-02: 1.5 mg via TRANSDERMAL

## 2015-12-02 MED ORDER — PROPOFOL 10 MG/ML IV BOLUS
INTRAVENOUS | Status: AC
  Filled 2015-12-02: qty 20

## 2015-12-02 MED ORDER — MIDAZOLAM HCL 2 MG/2ML IJ SOLN
INTRAMUSCULAR | Status: DC | PRN
  Administered 2015-12-02: 2 mg via INTRAVENOUS

## 2015-12-02 MED ORDER — FENTANYL CITRATE (PF) 100 MCG/2ML IJ SOLN: 25.0000 ug | INTRAMUSCULAR | Status: DC | PRN

## 2015-12-02 MED ORDER — GLYCOPYRROLATE 0.2 MG/ML IJ SOLN
INTRAMUSCULAR | Status: DC | PRN
  Administered 2015-12-02: 0.2 mg via INTRAVENOUS

## 2015-12-02 MED ORDER — FENTANYL CITRATE (PF) 100 MCG/2ML IJ SOLN
INTRAMUSCULAR | Status: DC | PRN
  Administered 2015-12-02 (×2): 100 ug via INTRAVENOUS

## 2015-12-02 MED ORDER — HYDROCODONE-ACETAMINOPHEN 7.5-325 MG PO TABS: 1.0000 | ORAL_TABLET | Freq: Once | ORAL | Status: DC | PRN

## 2015-12-02 MED ORDER — MIDAZOLAM HCL 2 MG/2ML IJ SOLN
INTRAMUSCULAR | Status: AC
  Filled 2015-12-02: qty 2

## 2015-12-02 MED ORDER — SODIUM CHLORIDE 0.9 % IJ SOLN
INTRAMUSCULAR | Status: AC
  Filled 2015-12-02: qty 50

## 2015-12-02 MED ORDER — DEXAMETHASONE SODIUM PHOSPHATE 4 MG/ML IJ SOLN
INTRAMUSCULAR | Status: AC
Start: 2015-12-02 — End: 2015-12-02
  Filled 2015-12-02: qty 1

## 2015-12-02 SURGICAL SUPPLY — 35 items
APPLICATOR COTTON TIP 6IN STRL (MISCELLANEOUS) ×1 IMPLANT
BLADE SURG 11 STRL SS (BLADE) ×2 IMPLANT
CLOTH BEACON ORANGE TIMEOUT ST (SAFETY) ×2 IMPLANT
CONTAINER PREFILL 10% NBF 60ML (FORM) ×2 IMPLANT
COUNTER NEEDLE 1200 MAGNETIC (NEEDLE) ×1 IMPLANT
ELECT BALL LEEP 5MM RED (ELECTRODE) ×1 IMPLANT
ELECT LOOP LEEP RND 10X10 YLW (CUTTING LOOP)
ELECT LOOP LEEP RND 15X12 GRN (CUTTING LOOP)
ELECT LOOP LEEP RND 20X12 WHT (CUTTING LOOP)
ELECT REM PT RETURN 9FT ADLT (ELECTROSURGICAL) ×2
ELECTRODE LOOP LP RND 10X10YLW (CUTTING LOOP) IMPLANT
ELECTRODE LOOP LP RND 15X12GRN (CUTTING LOOP) IMPLANT
ELECTRODE LOOP LP RND 20X12WHT (CUTTING LOOP) IMPLANT
ELECTRODE REM PT RTRN 9FT ADLT (ELECTROSURGICAL) ×1 IMPLANT
EVACUATOR PREFILTER SMOKE (MISCELLANEOUS) ×2 IMPLANT
GLOVE BIOGEL PI IND STRL 7.0 (GLOVE) ×1 IMPLANT
GLOVE BIOGEL PI IND STRL 8 (GLOVE) ×1 IMPLANT
GLOVE BIOGEL PI INDICATOR 7.0 (GLOVE) ×1
GLOVE BIOGEL PI INDICATOR 8 (GLOVE) ×1
GLOVE ECLIPSE 7.5 STRL STRAW (GLOVE) ×4 IMPLANT
GOWN STRL REUS W/TWL LRG LVL3 (GOWN DISPOSABLE) ×4 IMPLANT
HOSE NS SMOKE EVAC 7/8 X6 (MISCELLANEOUS) ×2 IMPLANT
NS IRRIG 1000ML POUR BTL (IV SOLUTION) ×1 IMPLANT
PACK VAGINAL MINOR WOMEN LF (CUSTOM PROCEDURE TRAY) ×2 IMPLANT
PAD OB MATERNITY 4.3X12.25 (PERSONAL CARE ITEMS) ×2 IMPLANT
PENCIL BUTTON HOLSTER BLD 10FT (ELECTRODE) ×2 IMPLANT
SCOPETTES 8  STERILE (MISCELLANEOUS) ×2
SCOPETTES 8 STERILE (MISCELLANEOUS) ×2 IMPLANT
SPONGE SURGIFOAM ABS GEL 12-7 (HEMOSTASIS) IMPLANT
SUT VIC AB 0 CT1 27 (SUTURE) ×4
SUT VIC AB 0 CT1 27XBRD ANBCTR (SUTURE) ×2 IMPLANT
SYR TB 1ML 25GX5/8 (SYRINGE) ×1 IMPLANT
TOWEL OR 17X24 6PK STRL BLUE (TOWEL DISPOSABLE) ×4 IMPLANT
TUBING SMOKE EVAC HOSE ADAPTER (MISCELLANEOUS) ×2 IMPLANT
WATER STERILE IRR 1000ML POUR (IV SOLUTION) ×2 IMPLANT

## 2015-12-02 NOTE — Anesthesia Postprocedure Evaluation (Signed)
Anesthesia Post Note  Patient: Destiny Rangel  Procedure(s) Performed: Procedure(s) (LRB): CONIZATION CERVIX WITH BIOPSY (N/A)  Patient location during evaluation: PACU Anesthesia Type: General Level of consciousness: awake and alert Pain management: pain level controlled Vital Signs Assessment: post-procedure vital signs reviewed and stable Respiratory status: spontaneous breathing, nonlabored ventilation and respiratory function stable Cardiovascular status: blood pressure returned to baseline and stable Postop Assessment: no signs of nausea or vomiting and adequate PO intake Anesthetic complications: no    Last Vitals:  Filed Vitals:   12/02/15 1500 12/02/15 1515  BP: 143/106 143/98  Pulse: 81 93  Temp:  37.2 C  Resp: 14 18    Last Pain:  Filed Vitals:   12/02/15 1530  PainSc: 4                  Pearson Reasons A.

## 2015-12-02 NOTE — Interval H&P Note (Signed)
History and Physical Interval Note:  12/02/2015 10:08 AM  Destiny MarseilleErin Baumler  has presented today for surgery, with the diagnosis of cervical intraepithelisa neoplasia III  The various methods of treatment have been discussed with the patient and family. After consideration of risks, benefits and other options for treatment, the patient has consented to  Procedure(s): CONIZATION CERVIX WITH BIOPSY (N/A) as a surgical intervention .  The patient's history has been reviewed, patient examined, no change in status, stable for surgery.  I have reviewed the patient's chart and labs.  Questions were answered to the patient's satisfaction.     Ok EdwardsFERNANDEZ,Pamela Maddy H

## 2015-12-02 NOTE — Discharge Instructions (Signed)

## 2015-12-02 NOTE — Anesthesia Procedure Notes (Signed)
Procedure Name: LMA Insertion Date/Time: 12/02/2015 1:37 PM Performed by: Shanon PayorGREGORY, Ranny Wiebelhaus M Pre-anesthesia Checklist: Patient identified, Emergency Drugs available, Suction available, Patient being monitored and Timeout performed Patient Re-evaluated:Patient Re-evaluated prior to inductionOxygen Delivery Method: Circle system utilized Preoxygenation: Pre-oxygenation with 100% oxygen Intubation Type: IV induction LMA: LMA inserted LMA Size: 4.0 Number of attempts: 1 Tube secured with: Tape Dental Injury: Teeth and Oropharynx as per pre-operative assessment

## 2015-12-02 NOTE — Anesthesia Preprocedure Evaluation (Signed)
Anesthesia Evaluation  Patient identified by MRN, date of birth, ID band Patient awake    Reviewed: Allergy & Precautions, H&P , NPO status , Patient's Chart, lab work & pertinent test results, reviewed documented beta blocker date and time   Airway Mallampati: I  TM Distance: >3 FB Neck ROM: full    Dental no notable dental hx. (+) Teeth Intact   Pulmonary neg pulmonary ROS, Current Smoker,    Pulmonary exam normal        Cardiovascular negative cardio ROS Normal cardiovascular exam     Neuro/Psych    GI/Hepatic negative GI ROS, Neg liver ROS,   Endo/Other  negative endocrine ROS  Renal/GU negative Renal ROS     Musculoskeletal   Abdominal Normal abdominal exam  (+)   Peds  Hematology negative hematology ROS (+)   Anesthesia Other Findings   Reproductive/Obstetrics negative OB ROS                             Anesthesia Physical Anesthesia Plan  ASA: II  Anesthesia Plan: General   Post-op Pain Management:    Induction: Intravenous  Airway Management Planned: LMA  Additional Equipment:   Intra-op Plan:   Post-operative Plan:   Informed Consent: I have reviewed the patients History and Physical, chart, labs and discussed the procedure including the risks, benefits and alternatives for the proposed anesthesia with the patient or authorized representative who has indicated his/her understanding and acceptance.     Plan Discussed with: CRNA and Surgeon  Anesthesia Plan Comments:         Anesthesia Quick Evaluation

## 2015-12-02 NOTE — Transfer of Care (Signed)
Immediate Anesthesia Transfer of Care Note  Patient: Destiny Rangel  Procedure(s) Performed: Procedure(s): CONIZATION CERVIX WITH BIOPSY (N/A)  Patient Location: PACU  Anesthesia Type:General  Level of Consciousness: awake, alert  and oriented  Airway & Oxygen Therapy: Patient Spontanous Breathing and Patient connected to nasal cannula oxygen  Post-op Assessment: Report given to RN and Post -op Vital signs reviewed and stable  Post vital signs: Reviewed and stable  Last Vitals:  Filed Vitals:   12/02/15 1149  BP: 170/113  Pulse: 111  Temp: 37.1 C  Resp: 16    Complications: No apparent anesthesia complications

## 2015-12-02 NOTE — Interval H&P Note (Signed)
History and Physical Interval Note:  12/02/2015 1:02 PM  Destiny Rangel  has presented today for surgery, with the diagnosis of cervical intraepithelisa neoplasia III  The various methods of treatment have been discussed with the patient and family. After consideration of risks, benefits and other options for treatment, the patient has consented to  Procedure(s): CONIZATION CERVIX WITH BIOPSY (N/A) as a surgical intervention .  The patient's history has been reviewed, patient examined, no change in status, stable for surgery.  I have reviewed the patient's chart and labs.  Questions were answered to the patient's satisfaction.     Ok EdwardsFERNANDEZ,Hervey Wedig H

## 2015-12-02 NOTE — Op Note (Signed)
    Operative Note  12/02/2015  2:26 PM  PATIENT:  Destiny Rangel  34 y.o. female  PRE-OPERATIVE DIAGNOSIS:  cervical intraepithelisa neoplasia III  POST-OPERATIVE DIAGNOSIS:  cervical intraepithelisa neoplasia III  PROCEDURE:  Procedure(s): CONIZATION CERVIX WITH BIOPSY  SURGEON:  Surgeon(s): Ok EdwardsJuan H Briana Newman, MD  ANESTHESIA:   general, also Pitressin 10 units in 30 cc of normal saline was injected into the cervical stroma at the 2, 4, 8, and 10:00 position for a total of 20 cc  FINDINGS: CIN-3 ectocervix 12:00 position CIN-1 6:00 position ectocervix. Transformation sewn visualized  DESCRIPTION OF OPERATION: The patient was taken to the operating room where she underwent successful general endotracheal anesthesia. A timeout was undertaken voicing the procedure to be performed such as a cold knife cervical conization. Patient was given 2 g of Cefotan prophylactically and had PAS stockings for DVT prophylaxis. The patient was placed in high lithotomy position. An Pitressin 10 units in 30 cc of saline was injected into the cervical stroma at the 2, 4, 8, and 10:00 position of the ectocervix for a total 20 cc. Local solution was then applied and the previous areas that were biopsy at the 12 and 6:00 position which demonstrated CIN-3 and CIN-1 respectively or identified. Suture of 0 Vicryl was placed from the 2 to the 4:00 position and another suture from the 8 to the 10:00 position for traction during the conization. With a curved knife blade a cylindrical superficial cone was obtained and passed off the operative field and placed on a cork board for pathological evaluation. The base of the cone biopsy site the bleeders were contained with ball electrode cauterization. Followed by placement of Monsel solution and both sutures at both angles were secured. Blood loss was minimal patient was extubated and transferred to recovery room stable vital signs.  ESTIMATED BLOOD LOSS:  Minimal   Intake/Output Summary (Last 24 hours) at 12/02/15 1426 Last data filed at 12/02/15 1400  Gross per 24 hour  Intake   1000 ml  Output     15 ml  Net    985 ml     BLOOD ADMINISTERED:none   LOCAL MEDICATIONS USED:  OTHER see above  SPECIMEN:  Source of Specimen:  Cervical cone   DISPOSITION OF SPECIMEN:  PATHOLOGY  COUNTS:  YES  PLAN OF CARE: Transfer to PACU  [facility] Medical CenterFERNANDEZ,Chitara Clonch HMD2:26 PMTD@

## 2015-12-03 ENCOUNTER — Encounter (HOSPITAL_COMMUNITY): Payer: Self-pay | Admitting: Gynecology

## 2015-12-16 ENCOUNTER — Telehealth: Payer: Self-pay

## 2015-12-16 ENCOUNTER — Encounter: Payer: Self-pay | Admitting: Gynecology

## 2015-12-16 ENCOUNTER — Ambulatory Visit (INDEPENDENT_AMBULATORY_CARE_PROVIDER_SITE_OTHER): Payer: BLUE CROSS/BLUE SHIELD | Admitting: Gynecology

## 2015-12-16 ENCOUNTER — Other Ambulatory Visit: Payer: Self-pay

## 2015-12-16 VITALS — BP 128/90

## 2015-12-16 DIAGNOSIS — IMO0002 Reserved for concepts with insufficient information to code with codable children: Secondary | ICD-10-CM

## 2015-12-16 DIAGNOSIS — N99821 Postprocedural hemorrhage and hematoma of a genitourinary system organ or structure following other procedure: Secondary | ICD-10-CM

## 2015-12-16 NOTE — Telephone Encounter (Signed)
Patient informed. Patient will be here at 2pm to see you.

## 2015-12-16 NOTE — Progress Notes (Signed)
   Patient presented to the office today stated that she had not had any vaginal bleeding since her surgery yesterday she passes small blood clot had some spotting this morning but this afternoon it has resolved. Review of her record indicated on December 13 patient underwent cold knife cervical conization as a result of her CIN-3.  Patient's preoperative colposcopy and biopsy demonstrated the following: Diagnosis 1. Endocervix, curettage - DETACHED BENIGN ENDOCERVICAL MUCOSAL FRAGMENTS. - SCANT DETACHED BENIGN SQUAMOUS CELLS PRESENT. - NO DYSPLASIA, ATYPIA OR MALIGNANCY IDENTIFIED. 2. Cervix, biopsy, 12 o'clock - TRANSFORMATION ZONE MUCOSA WITH HIGH GRADE SQUAMOUS INTRAEPITHELIAL LESION, CIN-III (SEVERE DYSPLASIA/CIS) INVOLVING ENDOCERVICAL GLANDS. 3. Cervix, biopsy, 6 o'clock - TRANSFORMATION ZONE MUCOSA WITH LOW GRADE SQUAMOUS INTRAEPITHELIAL LESION, CIN-I (MILD DYSPLASIA). - ACUTE AND CHRONIC CERVICITIS.  Her pathology report demonstrated the following after her cold knife cervical conization on December 13: Diagnosis Cervix, cone - HIGH GRADE SQUAMOUS INTRAEPITHELIAL LESION, CIN-II (MODERATE DYSPLASIA), FOCAL. - HIGH GRADE SQUAMOUS DYSPLASIA IS PRESENT AT THE ECTOCERVICAL SURGICAL RESECTION MARGIN. - SEE COMMENT. Microscopic Comment The findings essentially correlate with the results of the patient's recent biopsy  Exam: Bartholin urethra Skene was within normal limits Vagina: No lesions or discharge Cervical bed no active bleeding noted 2 small sutures wanted to 31 at the 9:00 position still present that was utilized in the use traction at time of the conization. To reinforce the area so overnight was applied. Patient was reassured.  Assessment/plan: Patient 2 weeks status post cold knife cervical conization as a result of CIN-3 pathology report demonstrated only CIN-2 see results above. Patient had some bleeding was asked to come and she was reassured no active bleeding noted.  She scheduled to return back for postop visit in 2 weeks.

## 2015-12-16 NOTE — Telephone Encounter (Addendum)
I had a voice mail message in patient dated 12/15/15 at 11:28pm.  Patient said she had surgery (Cone bx) on 12/02/15 and she is bleeding. In her message she said it started an hour ago and she is scared.  I called her back and left message to call me as soon as possible.

## 2015-12-16 NOTE — Telephone Encounter (Signed)
Have her come by the office today to see me before I leave

## 2015-12-16 NOTE — Telephone Encounter (Signed)
Patient had cone biopsy surgery on 12/02/15.  She continues to spot when she wipes with bathroom trips.  She said night before last if was enough to flow out of her in the bed but still not heavy by any means.  She said she was told to expect this for a week and a half but going on two weeks and she wanted to check with you.

## 2015-12-16 NOTE — Telephone Encounter (Signed)
I spoke with patient and scheduled her for office visit at 2pm today per Dr. Glenetta HewJF.

## 2015-12-25 ENCOUNTER — Encounter: Payer: Self-pay | Admitting: Family Medicine

## 2015-12-25 MED ORDER — AMPHETAMINE-DEXTROAMPHETAMINE 10 MG PO TABS: ORAL_TABLET | ORAL | Status: DC

## 2015-12-26 ENCOUNTER — Ambulatory Visit (INDEPENDENT_AMBULATORY_CARE_PROVIDER_SITE_OTHER): Payer: BLUE CROSS/BLUE SHIELD | Admitting: Gynecology

## 2015-12-26 ENCOUNTER — Encounter: Payer: Self-pay | Admitting: Gynecology

## 2015-12-26 VITALS — BP 130/84

## 2015-12-26 DIAGNOSIS — Z09 Encounter for follow-up examination after completed treatment for conditions other than malignant neoplasm: Secondary | ICD-10-CM

## 2015-12-26 DIAGNOSIS — Z87410 Personal history of cervical dysplasia: Secondary | ICD-10-CM

## 2015-12-26 NOTE — Progress Notes (Signed)
   Patient presents to the office for her 3 week postop visit. On December 13 patient underwent cold knife cervical conization as a result of CIN-3. Her dysplasia history, biopsies as well as: Biopsy results as follows:  Patient's preoperative colposcopy and biopsy demonstrated the following: Diagnosis 1. Endocervix, curettage - DETACHED BENIGN ENDOCERVICAL MUCOSAL FRAGMENTS. - SCANT DETACHED BENIGN SQUAMOUS CELLS PRESENT. - NO DYSPLASIA, ATYPIA OR MALIGNANCY IDENTIFIED. 2. Cervix, biopsy, 12 o'clock - TRANSFORMATION ZONE MUCOSA WITH HIGH GRADE SQUAMOUS INTRAEPITHELIAL LESION, CIN-III (SEVERE DYSPLASIA/CIS) INVOLVING ENDOCERVICAL GLANDS. 3. Cervix, biopsy, 6 o'clock - TRANSFORMATION ZONE MUCOSA WITH LOW GRADE SQUAMOUS INTRAEPITHELIAL LESION, CIN-I (MILD DYSPLASIA). - ACUTE AND CHRONIC CERVICITIS.  Her pathology report demonstrated the following after her cold knife cervical conization on December 13: Diagnosis Cervix, cone - HIGH GRADE SQUAMOUS INTRAEPITHELIAL LESION, CIN-II (MODERATE DYSPLASIA), FOCAL. - HIGH GRADE SQUAMOUS DYSPLASIA IS PRESENT AT THE ECTOCERVICAL SURGICAL RESECTION MARGIN. - SEE COMMENT. Microscopic Comment The findings essentially correlate with the results of the patient's recent biopsy  Patient is doing well reports no vaginal bleeding currently not sexually active as instructed.  Exam: Bartholin urethra Skene was within normal limits Vagina: No lesions or discharge Cervical bed 90% healed small suture material noted at the 3:00 position of the ectocervix Bimanual exam: Not done Rectal exam: Not done  Assessment/plan: Patient 3 weeks status post cold knife cervical conization as a result of CIN-3 pathology report demonstrated only CIN-2 see results above. Patient doing well. Patient to return back in 3 weeks for final postop visit.

## 2016-01-14 ENCOUNTER — Ambulatory Visit (INDEPENDENT_AMBULATORY_CARE_PROVIDER_SITE_OTHER): Payer: BLUE CROSS/BLUE SHIELD | Admitting: Gynecology

## 2016-01-14 ENCOUNTER — Encounter: Payer: Self-pay | Admitting: Gynecology

## 2016-01-14 VITALS — BP 132/80

## 2016-01-14 DIAGNOSIS — Z09 Encounter for follow-up examination after completed treatment for conditions other than malignant neoplasm: Secondary | ICD-10-CM

## 2016-01-14 DIAGNOSIS — Z87410 Personal history of cervical dysplasia: Secondary | ICD-10-CM

## 2016-01-14 NOTE — Progress Notes (Signed)
   Patient's a 35 year old who presented to the office for her final six-week postop visit. Her history is as follows:  On December 13 patient underwent cold knife cervical conization as a result of CIN-3. Her dysplasia history, biopsies as well as: Biopsy results as follows:  Patient's preoperative colposcopy and biopsy demonstrated the following: Diagnosis 1. Endocervix, curettage - DETACHED BENIGN ENDOCERVICAL MUCOSAL FRAGMENTS. - SCANT DETACHED BENIGN SQUAMOUS CELLS PRESENT. - NO DYSPLASIA, ATYPIA OR MALIGNANCY IDENTIFIED. 2. Cervix, biopsy, 12 o'clock - TRANSFORMATION ZONE MUCOSA WITH HIGH GRADE SQUAMOUS INTRAEPITHELIAL LESION, CIN-III (SEVERE DYSPLASIA/CIS) INVOLVING ENDOCERVICAL GLANDS. 3. Cervix, biopsy, 6 o'clock - TRANSFORMATION ZONE MUCOSA WITH LOW GRADE SQUAMOUS INTRAEPITHELIAL LESION, CIN-I (MILD DYSPLASIA). - ACUTE AND CHRONIC CERVICITIS.  Her pathology report demonstrated the following after her cold knife cervical conization on December 13: Diagnosis Cervix, cone - HIGH GRADE SQUAMOUS INTRAEPITHELIAL LESION, CIN-II (MODERATE DYSPLASIA), FOCAL. - HIGH GRADE SQUAMOUS DYSPLASIA IS PRESENT AT THE ECTOCERVICAL SURGICAL RESECTION MARGIN. - SEE COMMENT. Microscopic Comment The findings essentially correlate with the results of the patient's recent biopsy  Patient is doing well reports no vaginal bleeding currently not sexually active as instructed.  Exam: Bartholin urethra Skene was within normal limits Vagina: No lesions or discharge Cervical bed completely healed Bimanual exam: Not done Rectal exam: Not done  Assessment/plan: Patient 6 weeks status post cold knife cervical conization as a result of CIN-3 pathology reported CIN-2 C results above. Patient scheduled to return in September for her annual exam. I've recommended she have yearly Pap smears with HPV screening.

## 2016-01-23 ENCOUNTER — Ambulatory Visit (INDEPENDENT_AMBULATORY_CARE_PROVIDER_SITE_OTHER): Payer: BLUE CROSS/BLUE SHIELD | Admitting: Family Medicine

## 2016-01-23 ENCOUNTER — Encounter: Payer: Self-pay | Admitting: Family Medicine

## 2016-01-23 VITALS — BP 118/76 | HR 114 | Temp 98.8°F | Ht 66.0 in | Wt 130.4 lb

## 2016-01-23 DIAGNOSIS — F909 Attention-deficit hyperactivity disorder, unspecified type: Secondary | ICD-10-CM | POA: Diagnosis not present

## 2016-01-23 DIAGNOSIS — F988 Other specified behavioral and emotional disorders with onset usually occurring in childhood and adolescence: Secondary | ICD-10-CM

## 2016-01-23 DIAGNOSIS — R251 Tremor, unspecified: Secondary | ICD-10-CM

## 2016-01-23 MED ORDER — AMPHETAMINE-DEXTROAMPHETAMINE 10 MG PO TABS: ORAL_TABLET | ORAL | Status: DC

## 2016-01-23 MED ORDER — ALPRAZOLAM 0.25 MG PO TABS: 0.2500 mg | ORAL_TABLET | Freq: Three times a day (TID) | ORAL | Status: DC | PRN

## 2016-01-23 MED ORDER — AMPHETAMINE-DEXTROAMPHETAMINE 10 MG PO TABS
ORAL_TABLET | ORAL | Status: DC
Start: 2016-01-23 — End: 2016-04-23

## 2016-01-23 NOTE — Progress Notes (Signed)
Pre visit review using our clinic review tool, if applicable. No additional management support is needed unless otherwise documented below in the visit note. 

## 2016-01-23 NOTE — Patient Instructions (Signed)
Attention Deficit Hyperactivity Disorder  Attention deficit hyperactivity disorder (ADHD) is a problem with behavior issues based on the way the brain functions (neurobehavioral disorder). It is a common reason for behavior and academic problems in school.  SYMPTOMS   There are 3 types of ADHD. The 3 types and some of the symptoms include:  · Inattentive.    Gets bored or distracted easily.    Loses or forgets things. Forgets to hand in homework.    Has trouble organizing or completing tasks.    Difficulty staying on task.    An inability to organize daily tasks and school work.    Leaving projects, chores, or homework unfinished.    Trouble paying attention or responding to details. Careless mistakes.    Difficulty following directions. Often seems like is not listening.    Dislikes activities that require sustained attention (like chores or homework).  · Hyperactive-impulsive.    Feels like it is impossible to sit still or stay in a seat. Fidgeting with hands and feet.    Trouble waiting turn.    Talking too much or out of turn. Interruptive.    Speaks or acts impulsively.    Aggressive, disruptive behavior.    Constantly busy or on the go; noisy.    Often leaves seat when they are expected to remain seated.    Often runs or climbs where it is not appropriate, or feels very restless.  · Combined.    Has symptoms of both of the above.  Often children with ADHD feel discouraged about themselves and with school. They often perform well below their abilities in school.  As children get older, the excess motor activities can calm down, but the problems with paying attention and staying organized persist. Most children do not outgrow ADHD but with good treatment can learn to cope with the symptoms.  DIAGNOSIS   When ADHD is suspected, the diagnosis should be made by professionals trained in ADHD. This professional will collect information about the individual suspected of having ADHD. Information must be collected from  various settings where the person lives, works, or attends school.    Diagnosis will include:  · Confirming symptoms began in childhood.  · Ruling out other reasons for the child's behavior.  · The health care providers will check with the child's school and check their medical records.  · They will talk to teachers and parents.  · Behavior rating scales for the child will be filled out by those dealing with the child on a daily basis.  A diagnosis is made only after all information has been considered.  TREATMENT   Treatment usually includes behavioral treatment, tutoring or extra support in school, and stimulant medicines. Because of the way a person's brain works with ADHD, these medicines decrease impulsivity and hyperactivity and increase attention. This is different than how they would work in a person who does not have ADHD. Other medicines used include antidepressants and certain blood pressure medicines.  Most experts agree that treatment for ADHD should address all aspects of the person's functioning. Along with medicines, treatment should include structured classroom management at school. Parents should reward good behavior, provide constant discipline, and set limits. Tutoring should be available for the child as needed.  ADHD is a lifelong condition. If untreated, the disorder can have long-term serious effects into adolescence and adulthood.  HOME CARE INSTRUCTIONS   · Often with ADHD there is a lot of frustration among family members dealing with the condition. Blame   and anger are also feelings that are common. In many cases, because the problem affects the family as a whole, the entire family may need help. A therapist can help the family find better ways to handle the disruptive behaviors of the person with ADHD and promote change. If the person with ADHD is young, most of the therapist's work is with the parents. Parents will learn techniques for coping with and improving their child's behavior.  Sometimes only the child with the ADHD needs counseling. Your health care providers can help you make these decisions.  · Children with ADHD may need help learning how to organize. Some helpful tips include:  ¨ Keep routines the same every day from wake-up time to bedtime. Schedule all activities, including homework and playtime. Keep the schedule in a place where the person with ADHD will often see it. Mark schedule changes as far in advance as possible.  ¨ Schedule outdoor and indoor recreation.  ¨ Have a place for everything and keep everything in its place. This includes clothing, backpacks, and school supplies.  ¨ Encourage writing down assignments and bringing home needed books. Work with your child's teachers for assistance in organizing school work.  · Offer your child a well-balanced diet. Breakfast that includes a balance of whole grains, protein, and fruits or vegetables is especially important for school performance. Children should avoid drinks with caffeine including:  ¨ Soft drinks.  ¨ Coffee.  ¨ Tea.  ¨ However, some older children (adolescents) may find these drinks helpful in improving their attention. Because it can also be common for adolescents with ADHD to become addicted to caffeine, talk with your health care provider about what is a safe amount of caffeine intake for your child.  · Children with ADHD need consistent rules that they can understand and follow. If rules are followed, give small rewards. Children with ADHD often receive, and expect, criticism. Look for good behavior and praise it. Set realistic goals. Give clear instructions. Look for activities that can foster success and self-esteem. Make time for pleasant activities with your child. Give lots of affection.  · Parents are their children's greatest advocates. Learn as much as possible about ADHD. This helps you become a stronger and better advocate for your child. It also helps you educate your child's teachers and instructors  if they feel inadequate in these areas. Parent support groups are often helpful. A national group with local chapters is called Children and Adults with Attention Deficit Hyperactivity Disorder (CHADD).  SEEK MEDICAL CARE IF:  · Your child has repeated muscle twitches, cough, or speech outbursts.  · Your child has sleep problems.  · Your child has a marked loss of appetite.  · Your child develops depression.  · Your child has new or worsening behavioral problems.  · Your child develops dizziness.  · Your child has a racing heart.  · Your child has stomach pains.  · Your child develops headaches.  SEEK IMMEDIATE MEDICAL CARE IF:  · Your child has been diagnosed with depression or anxiety and the symptoms seem to be getting worse.  · Your child has been depressed and suddenly appears to have increased energy or motivation.  · You are worried that your child is having a bad reaction to a medication he or she is taking for ADHD.     This information is not intended to replace advice given to you by your health care provider. Make sure you discuss any questions you have with your   health care provider.     Document Released: 11/26/2002 Document Revised: 12/11/2013 Document Reviewed: 08/13/2013  [employer]vier Interactive Patient Education ©2016 [employer]

## 2016-01-23 NOTE — Progress Notes (Signed)
Patient ID: Destiny Rangel, female    DOB: Sep 09, 1981  Age: 35 y.o. MRN: [id number]    Subjective:  Subjective HPI Destiny Rangel presents for f/u add.  She states the meds are working well.     Review of Systems  Constitutional: Negative for diaphoresis, appetite change, fatigue and unexpected weight change.  Eyes: Negative for pain, redness and visual disturbance.  Respiratory: Negative for cough, chest tightness, shortness of breath and wheezing.   Cardiovascular: Negative for chest pain, palpitations and leg swelling.  Endocrine: Negative for cold intolerance, heat intolerance, polydipsia, polyphagia and polyuria.  Genitourinary: Negative for dysuria, frequency and difficulty urinating.  Neurological: Negative for dizziness, light-headedness, numbness and headaches.  Psychiatric/Behavioral: Negative for dysphoric mood and decreased concentration. The patient is not nervous/anxious.     History Past Medical History  Diagnosis Date  . Allergy   . ADHD (attention deficit hyperactivity disorder)     She has past surgical history that includes Wisdom tooth extraction and Cervical conization w/bx (N/A, 12/02/2015).   Her family history includes Cancer in her father, maternal grandfather, and mother; Parkinsonism in her maternal grandmother.She reports that she has been smoking Cigarettes.  She has never used smokeless tobacco. She reports that she drinks alcohol. She reports that she does not use illicit drugs.  Current Outpatient Prescriptions on File Prior to Visit  Medication Sig Dispense Refill  . norethindrone-ethinyl estradiol (JUNEL FE,GILDESS FE,LOESTRIN FE) 1-20 MG-MCG tablet Take 1 tablet by mouth daily. 1 Package 12   No current facility-administered medications on file prior to visit.     Objective:  Objective Physical Exam  Constitutional: She is oriented to person, place, and time. She appears well-developed and well-nourished.  HENT:  Head: Normocephalic and  atraumatic.  Eyes: Conjunctivae and EOM are normal.  Neck: Normal range of motion. Neck supple. No JVD present. Carotid bruit is not present. No thyromegaly present.  Cardiovascular: Normal rate, regular rhythm and normal heart sounds.   No murmur heard. Pulmonary/Chest: Effort normal and breath sounds normal. No respiratory distress. She has no wheezes. She has no rales. She exhibits no tenderness.  Musculoskeletal: She exhibits no edema.  Neurological: She is alert and oriented to person, place, and time.  Psychiatric: She has a normal mood and affect. Her behavior is normal. Judgment and thought content normal.  Nursing note and vitals reviewed.  BP 118/76 mmHg  Pulse 114  Temp(Src) 98.8 F (37.1 C) (Oral)  Ht  (1.676 m)  Wt 130 lb 6.4 oz (59.149 kg)  BMI 21.06 kg/m2  SpO2 98%  LMP 12/09/2015 Wt Readings from Last 3 Encounters:  01/23/16 130 lb 6.4 oz (59.149 kg)  12/02/15 135 lb (61.236 kg)  09/04/15 127 lb (57.607 kg)     Lab Results  Component Value Date   WBC 7.7 12/02/2015   HGB 13.8 12/02/2015   HCT 40.1 12/02/2015   PLT 261 12/02/2015   GLUCOSE 82 03/29/2013   CHOL 177 07/19/2012   TRIG 95 07/19/2012   HDL 58 07/19/2012   LDLCALC 100* 07/19/2012   ALT 19 03/29/2013   AST 20 03/29/2013   NA 135 03/29/2013   K 3.8 03/29/2013   CL 101 03/29/2013   CREATININE 0.5 03/29/2013   BUN 9 03/29/2013   CO2 25 03/29/2013   TSH 1.27 03/29/2013    No results found.   Assessment & Plan:  Plan I have discontinued Destiny Rangel's clindamycin and oxycodone-acetaminophen. I am also having her start on amphetamine-dextroamphetamine and amphetamine-dextroamphetamine. Additionally,  I am having her maintain her norethindrone-ethinyl estradiol, amphetamine-dextroamphetamine, and ALPRAZolam.  Meds ordered this encounter  Medications  . amphetamine-dextroamphetamine (ADDERALL) 10 MG tablet    Sig: 1 po qam and 2 po in afternoon    Dispense:  90 tablet    Refill:  0  .  amphetamine-dextroamphetamine (ADDERALL) 10 MG tablet    Sig: Take 1 po qam and 2 in the afternoon    Dispense:  90 tablet    Refill:  0    Do not fill until March 2017  . amphetamine-dextroamphetamine (ADDERALL) 10 MG tablet    Sig: Take 1 po qam and 2 in the afternoon    Dispense:  90 tablet    Refill:  0    Do not fill until April 2017  . ALPRAZolam (XANAX) 0.25 MG tablet    Sig: Take 1 tablet (0.25 mg total) by mouth 3 (three) times daily as needed for sleep.    Dispense:  30 tablet    Refill:  0    Problem List Items Addressed This Visit      Unprioritized   Tremor - Primary   Relevant Medications   ALPRAZolam (XANAX) 0.25 MG tablet    Other Visit Diagnoses    ADD (attention deficit disorder)        Relevant Medications    amphetamine-dextroamphetamine (ADDERALL) 10 MG tablet    amphetamine-dextroamphetamine (ADDERALL) 10 MG tablet    amphetamine-dextroamphetamine (ADDERALL) 10 MG tablet       Follow-up: Return in about 6 months (around 07/22/2016), or if symptoms worsen or fail to improve, for add.  Loreen Freud, DO

## 2016-04-22 ENCOUNTER — Other Ambulatory Visit: Payer: Self-pay | Admitting: Family Medicine

## 2016-04-22 ENCOUNTER — Encounter: Payer: Self-pay | Admitting: Family Medicine

## 2016-04-22 DIAGNOSIS — F988 Other specified behavioral and emotional disorders with onset usually occurring in childhood and adolescence: Secondary | ICD-10-CM

## 2016-04-23 MED ORDER — AMPHETAMINE-DEXTROAMPHETAMINE 10 MG PO TABS: ORAL_TABLET | ORAL | Status: DC

## 2016-07-08 ENCOUNTER — Encounter: Payer: Self-pay | Admitting: Family Medicine

## 2016-07-14 ENCOUNTER — Other Ambulatory Visit: Payer: Self-pay | Admitting: Family Medicine

## 2016-07-14 DIAGNOSIS — R251 Tremor, unspecified: Secondary | ICD-10-CM

## 2016-07-14 NOTE — Telephone Encounter (Signed)
Last seen and filled 01/23/16 #30   Please advise    KP

## 2016-07-15 MED ORDER — ALPRAZOLAM 0.25 MG PO TABS: 0.2500 mg | ORAL_TABLET | Freq: Three times a day (TID) | ORAL | 0 refills | Status: DC | PRN

## 2016-07-15 NOTE — Telephone Encounter (Signed)
Rx faxed.    KP 

## 2016-07-22 ENCOUNTER — Encounter: Payer: Self-pay | Admitting: Family Medicine

## 2016-07-22 DIAGNOSIS — F988 Other specified behavioral and emotional disorders with onset usually occurring in childhood and adolescence: Secondary | ICD-10-CM

## 2016-07-22 MED ORDER — AMPHETAMINE-DEXTROAMPHETAMINE 10 MG PO TABS: ORAL_TABLET | ORAL | 0 refills | Status: DC

## 2016-07-26 ENCOUNTER — Telehealth: Payer: Self-pay | Admitting: Family Medicine

## 2016-07-26 NOTE — Telephone Encounter (Signed)
Pt dropped off document to have on her chart for PCP information. ([employer] Screening Result Form from work) (yellow sheet). Document put at front desk tray.

## 2016-08-20 ENCOUNTER — Other Ambulatory Visit: Payer: Self-pay

## 2016-08-20 DIAGNOSIS — Z3041 Encounter for surveillance of contraceptive pills: Secondary | ICD-10-CM

## 2016-08-20 MED ORDER — NORETHIN ACE-ETH ESTRAD-FE 1-20 MG-MCG PO TABS: 1.0000 | ORAL_TABLET | Freq: Every day | ORAL | 0 refills | Status: DC

## 2016-09-07 ENCOUNTER — Encounter: Payer: Self-pay | Admitting: Women's Health

## 2016-09-07 ENCOUNTER — Ambulatory Visit (INDEPENDENT_AMBULATORY_CARE_PROVIDER_SITE_OTHER): Payer: BLUE CROSS/BLUE SHIELD | Admitting: Women's Health

## 2016-09-07 VITALS — BP 132/92 | Ht 66.0 in | Wt 132.0 lb

## 2016-09-07 DIAGNOSIS — Z3041 Encounter for surveillance of contraceptive pills: Secondary | ICD-10-CM | POA: Diagnosis not present

## 2016-09-07 DIAGNOSIS — Z113 Encounter for screening for infections with a predominantly sexual mode of transmission: Secondary | ICD-10-CM | POA: Diagnosis not present

## 2016-09-07 DIAGNOSIS — Z01419 Encounter for gynecological examination (general) (routine) without abnormal findings: Secondary | ICD-10-CM

## 2016-09-07 MED ORDER — NORETHIN ACE-ETH ESTRAD-FE 1-20 MG-MCG PO TABS: 1.0000 | ORAL_TABLET | Freq: Every day | ORAL | 4 refills | Status: DC

## 2016-09-07 NOTE — Patient Instructions (Signed)
Health Maintenance, Female Adopting a healthy lifestyle and getting preventive care can go a long way to promote health and wellness. Talk with your health care provider about what schedule of regular examinations is right for you. This is a good chance for you to check in with your provider about disease prevention and staying healthy. In between checkups, there are plenty of things you can do on your own. Experts have done a lot of research about which lifestyle changes and preventive measures are most likely to keep you healthy. Ask your health care provider for more information. WEIGHT AND DIET  Eat a healthy diet  Be sure to include plenty of vegetables, fruits, low-fat dairy products, and lean protein.  Do not eat a lot of foods high in solid fats, added sugars, or salt.  Get regular exercise. This is one of the most important things you can do for your health.  Most adults should exercise for at least 150 minutes each week. The exercise should increase your heart rate and make you sweat (moderate-intensity exercise).  Most adults should also do strengthening exercises at least twice a week. This is in addition to the moderate-intensity exercise.  Maintain a healthy weight  Body mass index (BMI) is a measurement that can be used to identify possible weight problems. It estimates body fat based on height and weight. Your health care provider can help determine your BMI and help you achieve or maintain a healthy weight.  For females 20 years of age and older:   A BMI below 18.5 is considered underweight.  A BMI of 18.5 to 24.9 is normal.  A BMI of 25 to 29.9 is considered overweight.  A BMI of 30 and above is considered obese.  Watch levels of cholesterol and blood lipids  You should start having your blood tested for lipids and cholesterol at 36 years of age, then have this test every 5 years.  You may need to have your cholesterol levels checked more often if:  Your lipid  or cholesterol levels are high.  You are older than 36 years of age.  You are at high risk for heart disease.  CANCER SCREENING   Lung Cancer  Lung cancer screening is recommended for adults 55-80 years old who are at high risk for lung cancer because of a history of smoking.  A yearly low-dose CT scan of the lungs is recommended for people who:  Currently smoke.  Have quit within the past 15 years.  Have at least a 30-pack-year history of smoking. A pack year is smoking an average of one pack of cigarettes a day for 1 year.  Yearly screening should continue until it has been 15 years since you quit.  Yearly screening should stop if you develop a health problem that would prevent you from having lung cancer treatment.  Breast Cancer  Practice breast self-awareness. This means understanding how your breasts normally appear and feel.  It also means doing regular breast self-exams. Let your health care provider know about any changes, no matter how small.  If you are in your 20s or 30s, you should have a clinical breast exam (CBE) by a health care provider every 1-3 years as part of a regular health exam.  If you are 40 or older, have a CBE every year. Also consider having a breast X-ray (mammogram) every year.  If you have a family history of breast cancer, talk to your health care provider about genetic screening.  If you   are at high risk for breast cancer, talk to your health care provider about having an MRI and a mammogram every year.  Breast cancer gene (BRCA) assessment is recommended for women who have family members with BRCA-related cancers. BRCA-related cancers include:  Breast.  Ovarian.  Tubal.  Peritoneal cancers.  Results of the assessment will determine the need for genetic counseling and BRCA1 and BRCA2 testing. Cervical Cancer Your health care provider may recommend that you be screened regularly for cancer of the pelvic organs (ovaries, uterus, and  vagina). This screening involves a pelvic examination, including checking for microscopic changes to the surface of your cervix (Pap test). You may be encouraged to have this screening done every 3 years, beginning at age 21.  For women ages 30-65, health care providers may recommend pelvic exams and Pap testing every 3 years, or they may recommend the Pap and pelvic exam, combined with testing for human papilloma virus (HPV), every 5 years. Some types of HPV increase your risk of cervical cancer. Testing for HPV may also be done on women of any age with unclear Pap test results.  Other health care providers may not recommend any screening for nonpregnant women who are considered low risk for pelvic cancer and who do not have symptoms. Ask your health care provider if a screening pelvic exam is right for you.  If you have had past treatment for cervical cancer or a condition that could lead to cancer, you need Pap tests and screening for cancer for at least 20 years after your treatment. If Pap tests have been discontinued, your risk factors (such as having a new sexual partner) need to be reassessed to determine if screening should resume. Some women have medical problems that increase the chance of getting cervical cancer. In these cases, your health care provider may recommend more frequent screening and Pap tests. Colorectal Cancer  This type of cancer can be detected and often prevented.  Routine colorectal cancer screening usually begins at 36 years of age and continues through 36 years of age.  Your health care provider may recommend screening at an earlier age if you have risk factors for colon cancer.  Your health care provider may also recommend using home test kits to check for hidden blood in the stool.  A small camera at the end of a tube can be used to examine your colon directly (sigmoidoscopy or colonoscopy). This is done to check for the earliest forms of colorectal  cancer.  Routine screening usually begins at age 50.  Direct examination of the colon should be repeated every 5-10 years through 36 years of age. However, you may need to be screened more often if early forms of precancerous polyps or small growths are found. Skin Cancer  Check your skin from head to toe regularly.  Tell your health care provider about any new moles or changes in moles, especially if there is a change in a mole's shape or color.  Also tell your health care provider if you have a mole that is larger than the size of a pencil eraser.  Always use sunscreen. Apply sunscreen liberally and repeatedly throughout the day.  Protect yourself by wearing long sleeves, pants, a wide-brimmed hat, and sunglasses whenever you are outside. HEART DISEASE, DIABETES, AND HIGH BLOOD PRESSURE   High blood pressure causes heart disease and increases the risk of stroke. High blood pressure is more likely to develop in:  People who have blood pressure in the high end   of the normal range (130-139/85-89 mm Hg).  People who are overweight or obese.  People who are African American.  If you are 38-23 years of age, have your blood pressure checked every 3-5 years. If you are 61 years of age or older, have your blood pressure checked every year. You should have your blood pressure measured twice--once when you are at a hospital or clinic, and once when you are not at a hospital or clinic. Record the average of the two measurements. To check your blood pressure when you are not at a hospital or clinic, you can use:  An automated blood pressure machine at a pharmacy.  A home blood pressure monitor.  If you are between 45 years and 39 years old, ask your health care provider if you should take aspirin to prevent strokes.  Have regular diabetes screenings. This involves taking a blood sample to check your fasting blood sugar level.  If you are at a normal weight and have a low risk for diabetes,  have this test once every three years after 36 years of age.  If you are overweight and have a high risk for diabetes, consider being tested at a younger age or more often. PREVENTING INFECTION  Hepatitis B  If you have a higher risk for hepatitis B, you should be screened for this virus. You are considered at high risk for hepatitis B if:  You were born in a country where hepatitis B is common. Ask your health care provider which countries are considered high risk.  Your parents were born in a high-risk country, and you have not been immunized against hepatitis B (hepatitis B vaccine).  You have HIV or AIDS.  You use needles to inject street drugs.  You live with someone who has hepatitis B.  You have had sex with someone who has hepatitis B.  You get hemodialysis treatment.  You take certain medicines for conditions, including cancer, organ transplantation, and autoimmune conditions. Hepatitis C  Blood testing is recommended for:  Everyone born from [birth year] through 1966.  Anyone with known risk factors for hepatitis C. Sexually transmitted infections (STIs)  You should be screened for sexually transmitted infections (STIs) including gonorrhea and chlamydia if:  You are sexually active and are younger than 36 years of age.  You are older than 36 years of age and your health care provider tells you that you are at risk for this type of infection.  Your sexual activity has changed since you were last screened and you are at an increased risk for chlamydia or gonorrhea. Ask your health care provider if you are at risk.  If you do not have HIV, but are at risk, it may be recommended that you take a prescription medicine daily to prevent HIV infection. This is called pre-exposure prophylaxis (PrEP). You are considered at risk if:  You are sexually active and do not regularly use condoms or know the HIV status of your partner(s).  You take drugs by injection.  You are sexually  active with a partner who has HIV. Talk with your health care provider about whether you are at high risk of being infected with HIV. If you choose to begin PrEP, you should first be tested for HIV. You should then be tested every 3 months for as long as you are taking PrEP.  PREGNANCY   If you are premenopausal and you may become pregnant, ask your health care provider about preconception counseling.  If you may  become pregnant, take 400 to 800 micrograms (mcg) of folic acid every day.  If you want to prevent pregnancy, talk to your health care provider about birth control (contraception). OSTEOPOROSIS AND MENOPAUSE   Osteoporosis is a disease in which the bones lose minerals and strength with aging. This can result in serious bone fractures. Your risk for osteoporosis can be identified using a bone density scan.  If you are 61 years of age or older, or if you are at risk for osteoporosis and fractures, ask your health care provider if you should be screened.  Ask your health care provider whether you should take a calcium or vitamin D supplement to lower your risk for osteoporosis.  Menopause may have certain physical symptoms and risks.  Hormone replacement therapy may reduce some of these symptoms and risks. Talk to your health care provider about whether hormone replacement therapy is right for you.  HOME CARE INSTRUCTIONS   Schedule regular health, dental, and eye exams.  Stay current with your immunizations.   Do not use any tobacco products including cigarettes, chewing tobacco, or electronic cigarettes.  If you are pregnant, do not drink alcohol.  If you are breastfeeding, limit how much and how often you drink alcohol.  Limit alcohol intake to no more than 1 drink per day for nonpregnant women. One drink equals 12 ounces of beer, 5 ounces of wine, or 1 ounces of hard liquor.  Do not use street drugs.  Do not share needles.  Ask your health care provider for help if  you need support or information about quitting drugs.  Tell your health care provider if you often feel depressed.  Tell your health care provider if you have ever been abused or do not feel safe at home.   This information is not intended to replace advice given to you by your health care provider. Make sure you discuss any questions you have with your health care provider.   Document Released: 06/21/2011 Document Revised: 12/27/2014 Document Reviewed: 11/07/2013 Elsevier Interactive Patient Education [employer].

## 2016-09-07 NOTE — Progress Notes (Signed)
Destiny Rangel 05-29-1981 [id number]    History:    Presents for annual exam.  Monthly cycle on Loestrin. New partner.. Gardasil series completed. 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  Past medical history, past surgical history, family history and social history were all reviewed and documented in the EPIC chart. works for [employer] with [employer]. Parents healthy. Lost about 70 pounds with diet and exercise 2 years ago has maintained.  ROS:  A ROS was performed and pertinent positives and negatives are included.  Exam:  Vitals:   09/07/16 0846  BP: (!) 132/92  Weight: 132 lb (59.9 kg)  Height: 5\' 6"  (1.676 m)   Body mass index is 21.31 kg/m.   General appearance:  Normal Thyroid:  Symmetrical, normal in size, without palpable masses or nodularity. Respiratory  Auscultation:  Clear without wheezing or rhonchi Cardiovascular  Auscultation:  Regular rate, without rubs, murmurs or gallops  Edema/varicosities:  Not grossly evident Abdominal  Soft,nontender, without masses, guarding or rebound.  Liver/spleen:  No organomegaly noted  Hernia:  None appreciated  Skin  Inspection:  Grossly normal   Breasts: Examined lying and sitting.     Right: Without masses, retractions, discharge or axillary adenopathy.     Left: Without masses, retractions, discharge or axillary adenopathy. Gentitourinary   Inguinal/mons:  Normal without inguinal adenopathy  External genitalia:  Normal  BUS/Urethra/Skene's glands:  Normal  Vagina:  Normal  Cervix:  Normal  Uterus:  normal in size, shape and contour.  Midline and mobile  Adnexa/parametria:     Rt: Without masses or tenderness.   Lt: Without masses or tenderness.  Anus and perineum: Normal  Digital rectal exam: Normal sphincter tone without palpated masses or  tenderness  Assessment/Plan:  35 y.o.  S WF G0 for annual eno complaints.   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 States always normal, and was normal at health screening. Loestrin 1/20 prescription, proper use, slight risk for blood clots and strokes. Reviewed importance of no/never smoking. SBE's, exercise, calcium rich diet, MVI daily encouraged. Continue healthy lifestyle. CBC, GC/Chlamydia, HIV, hep B, C, RPR. Pap with HR HPV typing.   Harrington ChallengerYOUNG,Zohar Maroney J [facility] LLCWHNP, 9:11 AM 09/07/2016

## 2016-09-07 NOTE — Addendum Note (Signed)
Addended by: Aura CampsWEBB, JENNIFER L on: 09/07/2016 09:32 AM   Modules accepted: Orders

## 2016-09-08 LAB — HEPATITIS C ANTIBODY: HCV AB: NEGATIVE

## 2016-09-08 LAB — GC/CHLAMYDIA PROBE AMP
CT PROBE, AMP APTIMA: NOT DETECTED
GC Probe RNA: NOT DETECTED

## 2016-09-08 LAB — HIV ANTIBODY (ROUTINE TESTING W REFLEX): HIV: NONREACTIVE

## 2016-09-08 LAB — RPR

## 2016-09-08 LAB — HEPATITIS B SURFACE ANTIGEN: Hepatitis B Surface Ag: NEGATIVE

## 2016-09-09 LAB — PAP, TP IMAGING W/ HPV RNA, RFLX HPV TYPE 16,18/45: HPV mRNA, High Risk: NOT DETECTED

## 2016-09-24 ENCOUNTER — Telehealth: Payer: Self-pay | Admitting: Family Medicine

## 2016-09-24 NOTE — Telephone Encounter (Signed)
Relation to WU:JWJXpt:self Call back number:[phone]   Reason for call:  Patient requesting a 3 month supply amphetamine-dextroamphetamine (ADDERALL) 10 MG tablet

## 2016-09-24 NOTE — Telephone Encounter (Signed)
She is 2 months overdue for her 6 mo f/u.   KP

## 2016-09-27 ENCOUNTER — Encounter: Payer: Self-pay | Admitting: Family Medicine

## 2016-09-27 ENCOUNTER — Telehealth: Payer: Self-pay | Admitting: Family Medicine

## 2016-09-27 DIAGNOSIS — F988 Other specified behavioral and emotional disorders with onset usually occurring in childhood and adolescence: Secondary | ICD-10-CM

## 2016-09-27 MED ORDER — AMPHETAMINE-DEXTROAMPHETAMINE 10 MG PO TABS: ORAL_TABLET | ORAL | 0 refills | Status: DC

## 2016-09-27 NOTE — Telephone Encounter (Signed)
Pt scheduled follow up on 11/08/16 at 10:15. Pt would to know if PCP could provide her with Rx for Adderall until her appt. She says that she is almost out?    Please advise.

## 2016-09-27 NOTE — Telephone Encounter (Signed)
LVM advising patient of message below °

## 2016-09-27 NOTE — Telephone Encounter (Signed)
Last seen 01/23/16 and filled 08/22/16 UDS 04/23/16 low risk Apt pending 11/08/16   Please advise     KP

## 2016-10-28 ENCOUNTER — Encounter: Payer: Self-pay | Admitting: Family Medicine

## 2016-10-28 NOTE — Telephone Encounter (Signed)
Last Rx 09/27/2016 04/23/16 uds sample given, low risk next screen 10/24/16. Next appt: 11/08/16  Please advise.

## 2016-10-28 NOTE — Telephone Encounter (Signed)
Ok to refill x 1  

## 2016-10-29 ENCOUNTER — Other Ambulatory Visit: Payer: Self-pay

## 2016-10-29 DIAGNOSIS — F988 Other specified behavioral and emotional disorders with onset usually occurring in childhood and adolescence: Secondary | ICD-10-CM

## 2016-10-29 MED ORDER — AMPHETAMINE-DEXTROAMPHETAMINE 10 MG PO TABS: ORAL_TABLET | ORAL | 0 refills | Status: DC

## 2016-10-29 NOTE — Telephone Encounter (Signed)
Patient called following up on this situation. [place] up her prescription today. She would also like a call when it is ready. Please advise

## 2016-11-01 NOTE — Telephone Encounter (Signed)
Refilled on 10/29/2016.

## 2016-11-08 ENCOUNTER — Ambulatory Visit (INDEPENDENT_AMBULATORY_CARE_PROVIDER_SITE_OTHER): Payer: BLUE CROSS/BLUE SHIELD | Admitting: Family Medicine

## 2016-11-08 ENCOUNTER — Encounter: Payer: Self-pay | Admitting: Family Medicine

## 2016-11-08 VITALS — BP 119/87 | HR 95 | Temp 98.7°F | Ht 66.0 in | Wt 138.6 lb

## 2016-11-08 DIAGNOSIS — Z0283 Encounter for blood-alcohol and blood-drug test: Secondary | ICD-10-CM | POA: Diagnosis not present

## 2016-11-08 DIAGNOSIS — F988 Other specified behavioral and emotional disorders with onset usually occurring in childhood and adolescence: Secondary | ICD-10-CM | POA: Diagnosis not present

## 2016-11-08 MED ORDER — AMPHETAMINE-DEXTROAMPHETAMINE 10 MG PO TABS: ORAL_TABLET | ORAL | 0 refills | Status: DC

## 2016-11-08 NOTE — Progress Notes (Signed)
Patient ID: Destiny Rangel, female    DOB: [birth year]  Age: 35 y.o. MRN: [id number]    Subjective:  Subjective  HPI Destiny Marseillerin Batty presents for f/u add.  No complaints.    Review of Systems  Constitutional: Negative for appetite change, diaphoresis, fatigue and unexpected weight change.  Eyes: Negative for pain, redness and visual disturbance.  Respiratory: Negative for cough, chest tightness, shortness of breath and wheezing.   Cardiovascular: Negative for chest pain, palpitations and leg swelling.  Endocrine: Negative for cold intolerance, heat intolerance, polydipsia, polyphagia and polyuria.  Genitourinary: Negative for difficulty urinating, dysuria and frequency.  Neurological: Negative for dizziness, light-headedness, numbness and headaches.  Psychiatric/Behavioral: Negative for behavioral problems, confusion, decreased concentration and dysphoric mood.    History Past Medical History:  Diagnosis Date  . ADHD (attention deficit hyperactivity disorder)   . Allergy     She has a past surgical history that includes Wisdom tooth extraction and Cervical conization w/bx (N/A, 12/02/2015).   Her family history includes Cancer in her father, maternal grandfather, and mother; Parkinsonism in her maternal grandmother.She reports that she has been smoking Cigarettes.  She has never used smokeless tobacco. She reports that she drinks alcohol. She reports that she does not use drugs.  Current Outpatient Prescriptions on File Prior to Visit  Medication Sig Dispense Refill  . ALPRAZolam (XANAX) 0.25 MG tablet Take 1 tablet (0.25 mg total) by mouth 3 (three) times daily as needed for sleep. 30 tablet 0  . norethindrone-ethinyl estradiol (JUNEL FE,GILDESS FE,LOESTRIN FE) 1-20 MG-MCG tablet Take 1 tablet by mouth daily. 3 Package 4   No current facility-administered medications on file prior to visit.      Objective:  Objective  Physical Exam  Constitutional: She is oriented to person, place,  and time. She appears well-developed and well-nourished.  HENT:  Head: Normocephalic and atraumatic.  Eyes: Conjunctivae and EOM are normal.  Neck: Normal range of motion. Neck supple. No JVD present. Carotid bruit is not present. No thyromegaly present.  Cardiovascular: Normal rate, regular rhythm and normal heart sounds.   No murmur heard. Pulmonary/Chest: Effort normal and breath sounds normal. No respiratory distress. She has no wheezes. She has no rales. She exhibits no tenderness.  Musculoskeletal: She exhibits no edema.  Neurological: She is alert and oriented to person, place, and time.  Psychiatric: She has a normal mood and affect. Her behavior is normal. Thought content normal.  Nursing note and vitals reviewed.  BP 119/87   Pulse 95   Temp 98.7 F (37.1 C) (Oral)   Ht 5\' 6"  (1.676 m)   Wt 138 lb 9.6 oz (62.9 kg)   LMP 10/11/2016   SpO2 100%   BMI 22.37 kg/m  Wt Readings from Last 3 Encounters:  11/08/16 138 lb 9.6 oz (62.9 kg)  09/07/16 132 lb (59.9 kg)  01/23/16 130 lb 6.4 oz (59.1 kg)     Lab Results  Component Value Date   WBC 7.7 12/02/2015   HGB 13.8 12/02/2015   HCT 40.1 12/02/2015   PLT 261 12/02/2015   GLUCOSE 82 03/29/2013   CHOL 177 07/19/2012   TRIG 95 07/19/2012   HDL 58 07/19/2012   LDLCALC 100 (H) 07/19/2012   ALT 19 03/29/2013   AST 20 03/29/2013   NA 135 03/29/2013   K 3.8 03/29/2013   CL 101 03/29/2013   CREATININE 0.5 03/29/2013   BUN 9 03/29/2013   CO2 25 03/29/2013   TSH 1.27 03/29/2013    No  results found.   Assessment & Plan:  Plan  I am having Ms. Verley start on amphetamine-dextroamphetamine and amphetamine-dextroamphetamine. I am also having her maintain her ALPRAZolam, norethindrone-ethinyl estradiol, and amphetamine-dextroamphetamine.  Meds ordered this encounter  Medications  . amphetamine-dextroamphetamine (ADDERALL) 10 MG tablet    Sig: 1 po qam and 2 po in afternoon    Dispense:  90 tablet    Refill:  0    Do  not fill until Dec 2017  . amphetamine-dextroamphetamine (ADDERALL) 10 MG tablet    Sig: 1 po qam and 2 po qpm    Dispense:  90 tablet    Refill:  0    Do not fill until  Jan 2018  . amphetamine-dextroamphetamine (ADDERALL) 10 MG tablet    Sig: 1 po qam and 2 po qpm    Dispense:  90 tablet    Refill:  0    Do not fill until Feb 2018    Problem List Items Addressed This Visit    None    Visit Diagnoses    Encounter for drug screening    -  Primary   Relevant Orders   POCT Urine Drug Screen   Attention deficit disorder (ADD) without hyperactivity       Relevant Medications   amphetamine-dextroamphetamine (ADDERALL) 10 MG tablet   amphetamine-dextroamphetamine (ADDERALL) 10 MG tablet   amphetamine-dextroamphetamine (ADDERALL) 10 MG tablet    stable on meds rto 6 months  Follow-up: Return in about 6 months (around 05/08/2017) for annual exam, fasting.  Donato SchultzYvonne R Lowne Chase, DO

## 2016-11-08 NOTE — Patient Instructions (Signed)

## 2016-11-08 NOTE — Progress Notes (Signed)
Pre visit review using our clinic tool,if applicable. No additional management support is needed unless otherwise documented below in the visit note.  

## 2016-11-09 ENCOUNTER — Encounter: Payer: Self-pay | Admitting: Family Medicine

## 2016-11-09 DIAGNOSIS — Z79899 Other long term (current) drug therapy: Secondary | ICD-10-CM | POA: Diagnosis not present

## 2016-12-02 ENCOUNTER — Encounter: Payer: Self-pay | Admitting: Family Medicine

## 2016-12-06 NOTE — Telephone Encounter (Signed)
Submitted to coding

## 2016-12-08 NOTE — Telephone Encounter (Signed)
This claim has been resubmitted patient has been notified vis phone call.

## 2017-02-22 ENCOUNTER — Other Ambulatory Visit: Payer: Self-pay | Admitting: *Deleted

## 2017-02-22 DIAGNOSIS — Z3041 Encounter for surveillance of contraceptive pills: Secondary | ICD-10-CM

## 2017-02-22 MED ORDER — NORETHIN ACE-ETH ESTRAD-FE 1-20 MG-MCG PO TABS: 1.0000 | ORAL_TABLET | Freq: Every day | ORAL | 1 refills | Status: DC

## 2017-03-07 ENCOUNTER — Encounter: Payer: Self-pay | Admitting: Family Medicine

## 2017-03-07 DIAGNOSIS — F988 Other specified behavioral and emotional disorders with onset usually occurring in childhood and adolescence: Secondary | ICD-10-CM

## 2017-03-07 MED ORDER — AMPHETAMINE-DEXTROAMPHETAMINE 10 MG PO TABS: ORAL_TABLET | ORAL | 0 refills | Status: DC

## 2017-03-07 NOTE — Telephone Encounter (Signed)
Ok to refill 3 months  

## 2017-03-07 NOTE — Telephone Encounter (Signed)
Requesting:   Adderall Contract    03/25/2015 UDS   Low risk done on 11/08/16--next is due on 06/08/17 Last OV    11/08/2016 Last Refill   Dec/Jan/feb 2018  Please Advise

## 2017-03-22 ENCOUNTER — Other Ambulatory Visit: Payer: Self-pay | Admitting: Family Medicine

## 2017-03-22 DIAGNOSIS — R251 Tremor, unspecified: Secondary | ICD-10-CM

## 2017-04-01 ENCOUNTER — Telehealth: Payer: Self-pay | Admitting: Family Medicine

## 2017-04-01 DIAGNOSIS — R251 Tremor, unspecified: Secondary | ICD-10-CM

## 2017-04-01 MED ORDER — ALPRAZOLAM 0.25 MG PO TABS: 0.2500 mg | ORAL_TABLET | Freq: Three times a day (TID) | ORAL | 0 refills | Status: DC | PRN

## 2017-04-01 NOTE — Telephone Encounter (Signed)
Requesting:  Alprazolam Contract    03/25/2015 UDS   Low risk----06/08/2017 Last OV    11/08/2016 Last Refill   #30 with 0 refills 07/15/2016  Please Advise-----[facility]----[phone]

## 2017-04-01 NOTE — Telephone Encounter (Signed)
Faxed hardcopy for alprazolam to [facility]

## 2017-04-01 NOTE — Telephone Encounter (Signed)
OK to refill

## 2017-05-04 ENCOUNTER — Encounter: Payer: Self-pay | Admitting: Gynecology

## 2017-06-07 ENCOUNTER — Encounter: Payer: Self-pay | Admitting: Family Medicine

## 2017-06-07 DIAGNOSIS — F988 Other specified behavioral and emotional disorders with onset usually occurring in childhood and adolescence: Secondary | ICD-10-CM

## 2017-06-07 MED ORDER — AMPHETAMINE-DEXTROAMPHETAMINE 10 MG PO TABS: ORAL_TABLET | ORAL | 0 refills | Status: DC

## 2017-06-07 NOTE — Telephone Encounter (Signed)
Refill 3 months

## 2017-07-25 ENCOUNTER — Other Ambulatory Visit: Payer: Self-pay | Admitting: Women's Health

## 2017-07-25 DIAGNOSIS — Z3041 Encounter for surveillance of contraceptive pills: Secondary | ICD-10-CM

## 2017-08-23 DIAGNOSIS — R030 Elevated blood-pressure reading, without diagnosis of hypertension: Secondary | ICD-10-CM | POA: Diagnosis not present

## 2017-08-23 DIAGNOSIS — L245 Irritant contact dermatitis due to other chemical products: Secondary | ICD-10-CM | POA: Diagnosis not present

## 2017-09-08 ENCOUNTER — Encounter: Payer: BLUE CROSS/BLUE SHIELD | Admitting: Women's Health

## 2017-09-12 ENCOUNTER — Encounter: Payer: Self-pay | Admitting: Women's Health

## 2017-09-12 ENCOUNTER — Ambulatory Visit (INDEPENDENT_AMBULATORY_CARE_PROVIDER_SITE_OTHER): Payer: BLUE CROSS/BLUE SHIELD | Admitting: Women's Health

## 2017-09-12 VITALS — BP 142/84 | Ht 66.0 in | Wt 143.0 lb

## 2017-09-12 DIAGNOSIS — Z01419 Encounter for gynecological examination (general) (routine) without abnormal findings: Secondary | ICD-10-CM

## 2017-09-12 DIAGNOSIS — Z3041 Encounter for surveillance of contraceptive pills: Secondary | ICD-10-CM

## 2017-09-12 MED ORDER — NORETHINDRONE 0.35 MG PO TABS: 1.0000 | ORAL_TABLET | Freq: Every day | ORAL | 4 refills | Status: DC

## 2017-09-12 NOTE — Addendum Note (Signed)
Addended by: Kem Parkinson on: 09/12/2017 10:35 AM   Modules accepted: Orders

## 2017-09-12 NOTE — Patient Instructions (Signed)

## 2017-09-12 NOTE — Progress Notes (Signed)
Destiny Rangel [birth year] [id number]    History:    Presents for annual exam.  Monthly cycle, on Loestrin with no complaints. Currently smoking 1x a week. Same partner x2 years- recently engaged. Gardasil series complete. 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 Recent health screening at work- reports cholesterol is WNL and BP is normally lower than it was in clinic today.  Past medical history, past surgical history, family history and social history were all reviewed and documented in the EPIC chart. Family hx of cancer on maternal and paternal side, parkinson's/dementia on maternal side. Parents healthy. 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 Sleeping and eating well- maintaining weight loss. Work gym for cardio- trying to go more often.  Taking a multivitamin daily. Anticipating pregnancy in two years.  ROS:  A ROS was performed and pertinent positives and negatives are included.  Exam:  Vitals:   09/12/17 0903  BP: (!) 142/84  Weight: 143 lb (64.9 kg)  Height:  (1.676 m)   Body mass index is 23.08 kg/m.   General appearance:  Normal Thyroid:  Symmetrical, normal in size, without palpable masses or nodularity. Respiratory  Auscultation:  Clear without wheezing or rhonchi Cardiovascular  Auscultation:  Regular rate, without rubs, murmurs or gallops  Edema/varicosities:  Not grossly evident Abdominal  Soft,nontender, without masses, guarding or rebound.  Liver/spleen:  No organomegaly noted  Hernia:  None appreciated  Skin  Inspection:  Grossly normal   Breasts: Examined lying and sitting.     Right: Without masses, retractions, discharge or axillary adenopathy.     Left: Without masses, retractions, discharge or axillary adenopathy. Gentitourinary   Inguinal/mons:  Normal without inguinal adenopathy  External genitalia:  Normal  BUS/Urethra/Skene's glands:   Normal  Vagina:  Normal  Cervix:  Normal  Uterus:  Normal in size, shape and contour.  Midline and mobile  Adnexa/parametria:     Rt: Without masses or tenderness.   Lt: Without masses or tenderness.  Anus and perineum: Normal  Digital rectal exam: Normal sphincter tone without palpated masses or tenderness  Assessment/Plan:  37 y.o. SWF G0  for annual exam.     Monthly cycle/ Loestrin- smoking weekly 11/2015 Conization CIN-3 with ectocervical margin Rubella screening Contraception Management ADD-primary care manages Adderall  Plan:   Reviewed BC options and discussed increased risk for clots/stroke with smoking, age and slightly elevated blood pressure,- switched to progesterone only option and discussed related education and start date. Instructed to call if problems with the change. Flu shot to be done at work. Continue SBEs, exercise and multivitamin with folic acid. Monitor BP and continue weight management efforts. Rubella screening Pap with HR HPV typing.   Harrington Challenger [facility], 9:22 AM 09/12/2017

## 2017-09-13 ENCOUNTER — Encounter: Payer: Self-pay | Admitting: Women's Health

## 2017-09-13 ENCOUNTER — Encounter: Payer: Self-pay | Admitting: Family Medicine

## 2017-09-13 DIAGNOSIS — F988 Other specified behavioral and emotional disorders with onset usually occurring in childhood and adolescence: Secondary | ICD-10-CM

## 2017-09-13 NOTE — Telephone Encounter (Signed)
Pt is requesting refill on Adderall.  Last OV: 11/08/2016, no future appt scheduled Last Fill: 06/07/2017 #90 and 0RF UDS: 11/08/2016 Low risk  NCCR printed; placed on ledge.

## 2017-09-13 NOTE — Telephone Encounter (Signed)
Pt needs ov  Can refill 1 month

## 2017-09-14 LAB — PAP, TP IMAGING W/ HPV RNA, RFLX HPV TYPE 16,18/45: HPV DNA High Risk: NOT DETECTED

## 2017-09-14 LAB — RUBELLA ANTIBODY, IGM

## 2017-09-14 MED ORDER — AMPHETAMINE-DEXTROAMPHETAMINE 10 MG PO TABS: ORAL_TABLET | ORAL | 0 refills | Status: DC

## 2017-09-14 NOTE — Telephone Encounter (Signed)
Rx placed at front desk for pick up at Pt's convenience. Pt informed via MyChart.  

## 2017-09-14 NOTE — Telephone Encounter (Signed)
Rx printed, awaiting DO signature.  

## 2017-09-21 ENCOUNTER — Encounter: Payer: Self-pay | Admitting: Family Medicine

## 2017-10-06 ENCOUNTER — Ambulatory Visit (INDEPENDENT_AMBULATORY_CARE_PROVIDER_SITE_OTHER): Payer: BLUE CROSS/BLUE SHIELD | Admitting: Family Medicine

## 2017-10-06 ENCOUNTER — Encounter: Payer: Self-pay | Admitting: Family Medicine

## 2017-10-06 VITALS — BP 118/78 | HR 88 | Temp 98.7°F | Ht 66.0 in | Wt 141.0 lb

## 2017-10-06 DIAGNOSIS — F988 Other specified behavioral and emotional disorders with onset usually occurring in childhood and adolescence: Secondary | ICD-10-CM

## 2017-10-06 DIAGNOSIS — Z23 Encounter for immunization: Secondary | ICD-10-CM

## 2017-10-06 MED ORDER — AMPHETAMINE-DEXTROAMPHETAMINE 10 MG PO TABS: ORAL_TABLET | ORAL | 0 refills | Status: DC

## 2017-10-06 NOTE — Patient Instructions (Signed)
Preventing Influenza, Adult Influenza, more commonly known as "the flu," is a viral infection that mainly affects the respiratory tract. The respiratory tract includes structures that help you breathe, such as the lungs, nose, and throat. The flu causes many common cold symptoms, as well as a high fever and body aches. The flu spreads easily from person to person (is contagious). The flu is most common from December through March. This is called flu season.You can catch the flu virus by:  Breathing in droplets from an infected person's cough or sneeze.  Touching something that was recently contaminated with the virus and then touching your mouth, nose, or eyes.  What can I do to lower my risk? You can decrease your risk of getting the flu by:  Getting a flu shot (influenza vaccination) every year. This is the best way to prevent the flu. A flu shot is recommended for everyone age 6 months and older. ? It is best to get a flu shot in the fall, as soon as it is available. Getting a flu shot during winter or spring instead is still a good idea. Flu season can last into early spring. ? Preventing the flu through vaccination requires getting a new flu shot every year. This is because the flu virus changes slightly (mutates) from one year to the next. Even if a flu shot does not completely protect you from all flu virus mutations, it can reduce the severity of your illness and prevent dangerous complications of the flu. ? If you are pregnant, you can and should get a flu shot. ? If you have had a reaction to the shot in the past or if you are allergic to eggs, check with your health care provider before getting a flu shot. ? Sometimes the vaccine is available as a nasal spray. In some years, the nasal spray has not been as effective against the flu virus. Check with your health care provider if you have questions about this.  Practicing good health habits. This is especially important during flu  season. ? Avoid contact with people who are sick with flu or cold symptoms. ? Wash your hands with soap and water often. If soap and water are not available, use hand sanitizer. ? Avoid touching your hands to your face, especially when you have not washed your hands recently. ? Use a disinfectant to clean surfaces at home and at work that may be contaminated with the flu virus. ? Keep your body's disease-fighting system (immune system) in good shape by eating a healthy diet, drinking plenty of fluids, getting enough sleep, and exercising regularly.  If you do get the flu, avoid spreading it to others by:  Staying home until your symptoms have been gone for at least one day.  Covering your mouth and nose with your elbow when you cough or sneeze.  Avoiding close contact with others, especially babies and elderly people.  Why are these changes important? Getting a flu shot and practicing good health habits protects you as well as other people. If you get the flu, your friends, family, and co-workers are also at risk of getting it, because it spreads so easily to others. Each year, about 2 out of every 10 people get the flu. Having the flu can lead to complications, such as pneumonia, ear infection, and sinus infection. The flu also can be deadly, especially for babies, people older than age 65, and people who have serious long-term diseases. How is this treated? Most people recover   from the flu by resting at home and drinking plenty of fluids. However, a prescription antiviral medicine may reduce your flu symptoms and may make your flu go away sooner. This medicine must be started within a few days of getting flu symptoms. You can talk with your health care provider about whether you need an antiviral medicine. Antiviral medicine may be prescribed for people who are at risk for more serious flu symptoms. This includes people who:  Are older than age 65.  Are pregnant.  Have a condition that  makes the flu worse or more dangerous.  Where to find more information:  Centers for Disease Control and Prevention: www.cdc.gov/flu/index.htm  Flu.gov: www.flu.gov/prevention-vaccination  American Academy of Family Physicians: familydoctor.org/familydoctor/en/kids/vaccines/preventing-the-flu.html Contact a health care provider if:  You have influenza and you develop new symptoms.  You have: ? Chest pain. ? Diarrhea. ? A fever.  Your cough gets worse, or you produce more mucus. Summary  The best way to prevent the flu is to get a flu shot every year in the fall.  Even if you get the flu after you have received the yearly vaccine, your flu may be milder and go away sooner because of your flu shot.  If you get the flu, antiviral medicines that are started with a few days of symptoms may reduce your flu symptoms and may make your flu go away sooner.  You can also help prevent the flu by practicing good health habits. This information is not intended to replace advice given to you by your health care provider. Make sure you discuss any questions you have with your health care provider. Document Released: 12/21/2015 Document Revised: 08/14/2016 Document Reviewed: 08/14/2016 Elsevier Interactive Patient Education  2018 Elsevier Inc.  

## 2017-10-06 NOTE — Progress Notes (Signed)
Patient ID: Destiny Rangel, female    DOB: February 08, 1981  Age: 36 y.o. MRN: [id number]    Subjective:  Subjective  HPI Destiny Rangel presents for refill on adderall and she needs an MMR (see labs from [facility]) and a flu shot.    Review of Systems  Constitutional: Negative for appetite change, diaphoresis, fatigue and unexpected weight change.  HENT: Positive for congestion and sneezing. Negative for ear discharge, ear pain, postnasal drip and sinus pressure.   Eyes: Negative for pain, redness and visual disturbance.  Respiratory: Negative for cough, chest tightness, shortness of breath and wheezing.   Cardiovascular: Negative for chest pain, palpitations and leg swelling.  Endocrine: Negative for cold intolerance, heat intolerance, polydipsia, polyphagia and polyuria.  Genitourinary: Negative for difficulty urinating, dysuria and frequency.  Neurological: Negative for dizziness, light-headedness, numbness and headaches.    History Past Medical History:  Diagnosis Date  . ADHD (attention deficit hyperactivity disorder)   . Allergy     She has a past surgical history that includes Wisdom tooth extraction and Cervical conization w/bx (N/A, 12/02/2015).   Her family history includes Alcohol abuse in her unknown relative; Cancer in her father, maternal grandfather, and mother; Depression in her unknown relative; Parkinsonism in her maternal grandmother.She reports that she has been smoking Cigarettes.  She has never used smokeless tobacco. She reports that she drinks alcohol. She reports that she does not use drugs.  Current Outpatient Prescriptions on File Prior to Visit  Medication Sig Dispense Refill  . ALPRAZolam (XANAX) 0.25 MG tablet Take 1 tablet (0.25 mg total) by mouth 3 (three) times daily as needed. 30 tablet 0  . BLISOVI FE 1/20 1-20 MG-MCG tablet TAKE 1 TABLET DAILY 84 tablet 0  . norethindrone (ORTHO MICRONOR) 0.35 MG tablet Take 1 tablet (0.35 mg total) by mouth daily. 3 Package 4    No current facility-administered medications on file prior to visit.      Objective:  Objective  Physical Exam  Constitutional: She is oriented to person, place, and time. She appears well-developed and well-nourished.  HENT:  Head: Normocephalic and atraumatic.  Eyes: Conjunctivae and EOM are normal.  Neck: Normal range of motion. Neck supple. No JVD present. Carotid bruit is not present. No thyromegaly present.  Cardiovascular: Normal rate, regular rhythm and normal heart sounds.   No murmur heard. Pulmonary/Chest: Effort normal and breath sounds normal. No respiratory distress. She has no wheezes. She has no rales. She exhibits no tenderness.  Musculoskeletal: She exhibits no edema.  Neurological: She is alert and oriented to person, place, and time.  Psychiatric: She has a normal mood and affect.  Nursing note and vitals reviewed.  BP 118/78   Pulse 88   Temp 98.7 F (37.1 C) (Oral)   Ht _0  (1.676 m)   Wt 141 lb (64 kg)   LMP 10/05/2017 (Exact Date)   SpO2 98%   BMI 22.76 kg/m  Wt Readings from Last 3 Encounters:  10/06/17 141 lb (64 kg)  09/12/17 143 lb (64.9 kg)  11/08/16 138 lb 9.6 oz (62.9 kg)     Lab Results  Component Value Date   WBC 7.7 12/02/2015   HGB 13.8 12/02/2015   HCT 40.1 12/02/2015   PLT 261 12/02/2015   GLUCOSE 82 03/29/2013   CHOL 177 07/19/2012   TRIG 95 07/19/2012   HDL 58 07/19/2012   LDLCALC 100 (H) 07/19/2012   ALT 19 03/29/2013   AST 20 03/29/2013   NA 135 03/29/2013  K 3.8 03/29/2013   CL 101 03/29/2013   CREATININE 0.5 03/29/2013   BUN 9 03/29/2013   CO2 25 03/29/2013   TSH 1.27 03/29/2013    No results found.   Assessment & Plan:  Plan  I am having Ms. Topper start on amphetamine-dextroamphetamine and amphetamine-dextroamphetamine. I am also having her maintain her ALPRAZolam, BLISOVI FE 1/20, norethindrone, and amphetamine-dextroamphetamine.  Meds ordered this encounter  Medications  .  amphetamine-dextroamphetamine (ADDERALL) 10 MG tablet    Sig: Take 1 tablet by mouth every morning and 2 tablets by mouth every afternoon    Dispense:  90 tablet    Refill:  0    Do not fill until Oct 20 2017  . amphetamine-dextroamphetamine (ADDERALL) 10 MG tablet    Sig: 1 po qam and 2 po in afternoon    Dispense:  90 tablet    Refill:  0    Do not fill until Nov 19, 2017  . amphetamine-dextroamphetamine (ADDERALL) 10 MG tablet    Sig: 1 po qam and 2 po in afternoon    Dispense:  90 tablet    Refill:  0    Do not fill until Dec 20, 2017    Problem List Items Addressed This Visit      Unprioritized   Attention deficit disorder (ADD) without hyperactivity    Stable con't meds rto 6 months      Relevant Medications   amphetamine-dextroamphetamine (ADDERALL) 10 MG tablet   amphetamine-dextroamphetamine (ADDERALL) 10 MG tablet   amphetamine-dextroamphetamine (ADDERALL) 10 MG tablet    Other Visit Diagnoses    Need for immunization against influenza    -  Primary   Relevant Orders   Flu Vaccine QUAD 6+ mos IM (Fluarix) (Completed)      Follow-up: Return in about 6 months (around 04/06/2018) for add.  Ann Held, DO

## 2017-10-07 DIAGNOSIS — F988 Other specified behavioral and emotional disorders with onset usually occurring in childhood and adolescence: Secondary | ICD-10-CM | POA: Insufficient documentation

## 2017-10-07 NOTE — Assessment & Plan Note (Signed)
Stable con't meds rto 6 months 

## 2018-01-06 ENCOUNTER — Encounter: Payer: Self-pay | Admitting: Family Medicine

## 2018-01-26 ENCOUNTER — Encounter: Payer: Self-pay | Admitting: Family Medicine

## 2018-01-27 NOTE — Telephone Encounter (Signed)
Requesting: Adderall 10mg  Contract: Yes UDS:  11.20.2017 Low-Risk; next screen 6.20.2018 Last OV: 10.18.2019 Next OV: 4.18.2019 Last Refill: 1.01.2019   Please advise

## 2018-01-27 NOTE — Telephone Encounter (Signed)
We need uds and contract Ok to refill when she comes in for that

## 2018-02-02 ENCOUNTER — Ambulatory Visit: Payer: BLUE CROSS/BLUE SHIELD | Admitting: Family Medicine

## 2018-02-09 ENCOUNTER — Ambulatory Visit: Payer: BLUE CROSS/BLUE SHIELD | Admitting: Family Medicine

## 2018-02-09 ENCOUNTER — Other Ambulatory Visit (INDEPENDENT_AMBULATORY_CARE_PROVIDER_SITE_OTHER): Payer: BLUE CROSS/BLUE SHIELD

## 2018-02-09 ENCOUNTER — Other Ambulatory Visit: Payer: Self-pay | Admitting: *Deleted

## 2018-02-09 DIAGNOSIS — Z79899 Other long term (current) drug therapy: Secondary | ICD-10-CM

## 2018-02-09 DIAGNOSIS — F988 Other specified behavioral and emotional disorders with onset usually occurring in childhood and adolescence: Secondary | ICD-10-CM

## 2018-02-09 MED ORDER — AMPHETAMINE-DEXTROAMPHETAMINE 10 MG PO TABS: ORAL_TABLET | ORAL | 0 refills | Status: DC

## 2018-02-15 LAB — PAIN MGMT, PROFILE 8 W/CONF, U
6 Acetylmorphine: NEGATIVE ng/mL (ref <10)
ALCOHOL METABOLITES: POSITIVE ng/mL — AB (ref <500)
AMPHETAMINE: 4602 ng/mL — AB (ref <250)
Amphetamines: POSITIVE ng/mL — AB (ref <500)
Benzodiazepines: NEGATIVE ng/mL (ref <100)
Buprenorphine, Urine: NEGATIVE ng/mL (ref <5)
COCAINE METABOLITE: NEGATIVE ng/mL (ref <150)
Creatinine: 216.4 mg/dL
Ethyl Sulfate (ETS): >200000 ng/mL — ABNORMAL HIGH (ref <100)
MDMA: NEGATIVE ng/mL (ref <500)
METHAMPHETAMINE: NEGATIVE ng/mL (ref <250)
Marijuana Metabolite: NEGATIVE ng/mL (ref <20)
OXYCODONE: NEGATIVE ng/mL (ref <100)
Opiates: NEGATIVE ng/mL (ref <100)
Oxidant: NEGATIVE ug/mL (ref <200)
pH: 6.96 (ref 4.5–9.0)

## 2018-04-06 ENCOUNTER — Ambulatory Visit: Payer: BLUE CROSS/BLUE SHIELD | Admitting: Family Medicine

## 2018-04-06 ENCOUNTER — Encounter: Payer: Self-pay | Admitting: Family Medicine

## 2018-04-06 DIAGNOSIS — F988 Other specified behavioral and emotional disorders with onset usually occurring in childhood and adolescence: Secondary | ICD-10-CM

## 2018-04-06 MED ORDER — AMPHETAMINE-DEXTROAMPHETAMINE 10 MG PO TABS: ORAL_TABLET | ORAL | 0 refills | Status: DC

## 2018-04-06 NOTE — Progress Notes (Signed)
Patient ID: Destiny Rangel, female   DOB: Apr 16, 1981, 37 y.o.   MRN: [id number]    Subjective:  I acted as a Education administrator for Dr. Carollee Herter.  Guerry Bruin, [place]   Patient ID: Destiny Rangel, female    DOB: [birth year], 37 y.o.   MRN: [id number]  Chief Complaint  Patient presents with  . ADD    HPI  Patient is in today for ADD follow up.  She is doing well on current treatment.  No complaints.   Patient Care Team: Carollee Herter, Alferd Apa, DO as PCP - General   Past Medical History:  Diagnosis Date  . ADHD (attention deficit hyperactivity disorder)   . Allergy     Past Surgical History:  Procedure Laterality Date  . CERVICAL CONIZATION W/BX N/A 12/02/2015   Procedure: CONIZATION CERVIX WITH BIOPSY;  Surgeon: Terrance Mass, MD;  Location: [facility];  Service: Gynecology;  Laterality: N/A;  . WISDOM TOOTH EXTRACTION      Family History  Problem Relation Age of Onset  . Parkinsonism Maternal Grandmother        dementia  . Cancer Maternal Grandfather   . Alcohol abuse Unknown   . Depression Unknown   . Cancer Mother        skin  . Cancer Father        skin    Social History   Socioeconomic History  . Marital status: Single    Spouse name: Not on file  . Number of children: Not on file  . Years of education: Not on file  . Highest education level: Not on file  Occupational History  . Not on file  Social Needs  . Financial resource strain: Not on file  . Food insecurity:    Worry: Not on file    Inability: Not on file  . Transportation needs:    Medical: Not on file    Non-medical: Not on file  Tobacco Use  . Smoking status: Current Some Day Smoker    Types: Cigarettes  . Smokeless tobacco: Never Used  Substance and Sexual Activity  . Alcohol use: Yes    Comment: socially  . Drug use: No  . Sexual activity: Yes    Partners: Male    Birth control/protection: Pill, Condom    Comment: INTERCOURSE AGE 33, MORE THAN 5  Lifestyle  . Physical activity:    Days per week: Not  on file    Minutes per session: Not on file  . Stress: Not on file  Relationships  . Social connections:    Talks on phone: Not on file    Gets together: Not on file    Attends religious service: Not on file    Active member of club or organization: Not on file    Attends meetings of clubs or organizations: Not on file    Relationship status: Not on file  . Intimate partner violence:    Fear of current or ex partner: Not on file    Emotionally abused: Not on file    Physically abused: Not on file    Forced sexual activity: Not on file  Other Topics Concern  . Not on file  Social History Narrative  . Not on file    Outpatient Medications Prior to Visit  Medication Sig Dispense Refill  . ALPRAZolam (XANAX) 0.25 MG tablet Take 1 tablet (0.25 mg total) by mouth 3 (three) times daily as needed. 30 tablet 0  . norethindrone (ORTHO MICRONOR) 0.35 MG tablet Take  1 tablet (0.35 mg total) by mouth daily. 3 Package 4  . amphetamine-dextroamphetamine (ADDERALL) 10 MG tablet Take 1 tablet by mouth every morning and 2 tablets by mouth every afternoon 90 tablet 0  . amphetamine-dextroamphetamine (ADDERALL) 10 MG tablet 1 po qam and 2 po in afternoon 90 tablet 0  . amphetamine-dextroamphetamine (ADDERALL) 10 MG tablet 1 po qam and 2 po in afternoon 90 tablet 0  . BLISOVI FE 1/20 1-20 MG-MCG tablet TAKE 1 TABLET DAILY 84 tablet 0   No facility-administered medications prior to visit.     No Known Allergies  Review of Systems  Constitutional: Negative for fever and malaise/fatigue.  HENT: Negative for congestion.   Eyes: Negative for blurred vision.  Respiratory: Negative for cough and shortness of breath.   Cardiovascular: Negative for chest pain, palpitations and leg swelling.  Gastrointestinal: Negative for vomiting.  Musculoskeletal: Negative for back pain.  Skin: Negative for rash.  Neurological: Negative for loss of consciousness and headaches.       Objective:    Physical Exam   Constitutional: She is oriented to person, place, and time. She appears well-developed and well-nourished.  HENT:  Head: Normocephalic and atraumatic.  Eyes: Conjunctivae and EOM are normal.  Neck: Normal range of motion. Neck supple. No JVD present. Carotid bruit is not present. No thyromegaly present.  Cardiovascular: Normal rate, regular rhythm and normal heart sounds.  No murmur heard. Pulmonary/Chest: Effort normal and breath sounds normal. No respiratory distress. She has no wheezes. She has no rales. She exhibits no tenderness.  Musculoskeletal: She exhibits no edema.  Neurological: She is alert and oriented to person, place, and time.  Psychiatric: She has a normal mood and affect.  Nursing note and vitals reviewed.   BP 130/88 (BP Location: Right Arm, Cuff Size: Normal)   Pulse 88   Temp 98.5 F (36.9 C) (Oral)   Resp 16   Ht 5' 6"  (1.676 m)   Wt 152 lb 6.4 oz (69.1 kg)   LMP 02/17/2018   SpO2 98%   BMI 24.60 kg/m  Wt Readings from Last 3 Encounters:  04/06/18 152 lb 6.4 oz (69.1 kg)  10/06/17 141 lb (64 kg)  09/12/17 143 lb (64.9 kg)   BP Readings from Last 3 Encounters:  04/06/18 130/88  10/06/17 118/78  09/12/17 (!) 142/84     Immunization History  Administered Date(s) Administered  . Hpv 11/03/2007  . Influenza,inj,Quad PF,6+ Mos 10/01/2014, 10/17/2015, 10/06/2017  . MMR 10/06/2017  . Tdap 05/21/2014    Health Maintenance  Topic Date Due  . INFLUENZA VACCINE  07/20/2018  . PAP SMEAR  09/08/2019  . TETANUS/TDAP  05/21/2024  . HIV Screening  Completed    Lab Results  Component Value Date   WBC 7.7 12/02/2015   HGB 13.8 12/02/2015   HCT 40.1 12/02/2015   PLT 261 12/02/2015   GLUCOSE 82 03/29/2013   CHOL 177 07/19/2012   TRIG 95 07/19/2012   HDL 58 07/19/2012   LDLCALC 100 (H) 07/19/2012   ALT 19 03/29/2013   AST 20 03/29/2013   NA 135 03/29/2013   K 3.8 03/29/2013   CL 101 03/29/2013   CREATININE 0.5 03/29/2013   BUN 9 03/29/2013   CO2  25 03/29/2013   TSH 1.27 03/29/2013    Lab Results  Component Value Date   TSH 1.27 03/29/2013   Lab Results  Component Value Date   WBC 7.7 12/02/2015   HGB 13.8 12/02/2015   HCT 40.1 12/02/2015  MCV 92.0 12/02/2015   PLT 261 12/02/2015   Lab Results  Component Value Date   NA 135 03/29/2013   K 3.8 03/29/2013   CO2 25 03/29/2013   GLUCOSE 82 03/29/2013   BUN 9 03/29/2013   CREATININE 0.5 03/29/2013   BILITOT 0.4 03/29/2013   ALKPHOS 35 (L) 03/29/2013   AST 20 03/29/2013   ALT 19 03/29/2013   PROT 7.6 03/29/2013   ALBUMIN 4.3 03/29/2013   CALCIUM 9.1 03/29/2013   GFR 162.42 03/29/2013   Lab Results  Component Value Date   CHOL 177 07/19/2012   Lab Results  Component Value Date   HDL 58 07/19/2012   Lab Results  Component Value Date   LDLCALC 100 (H) 07/19/2012   Lab Results  Component Value Date   TRIG 95 07/19/2012   Lab Results  Component Value Date   CHOLHDL 3.1 07/19/2012   No results found for: HGBA1C       Assessment & Plan:   Problem List Items Addressed This Visit      Unprioritized   Attention deficit disorder (ADD) without hyperactivity   Relevant Medications   amphetamine-dextroamphetamine (ADDERALL) 10 MG tablet   amphetamine-dextroamphetamine (ADDERALL) 10 MG tablet   amphetamine-dextroamphetamine (ADDERALL) 10 MG tablet    stable con't meds rto 6 months or sooner prn   I have discontinued Tisheena Schnitker's BLISOVI FE 1/20. I am also having her maintain her ALPRAZolam, norethindrone, amphetamine-dextroamphetamine, amphetamine-dextroamphetamine, and amphetamine-dextroamphetamine.  Meds ordered this encounter  Medications  . DISCONTD: amphetamine-dextroamphetamine (ADDERALL) 10 MG tablet    Sig: Take 1 tablet by mouth every morning and 2 tablets by mouth every afternoon    Dispense:  90 tablet    Refill:  0    Do not fill on or after July 2019  . DISCONTD: amphetamine-dextroamphetamine (ADDERALL) 10 MG tablet    Sig: 1 po qam  and 2 po in afternoon    Dispense:  90 tablet    Refill:  0    Do not fill until on or after 05/2018  . DISCONTD: amphetamine-dextroamphetamine (ADDERALL) 10 MG tablet    Sig: 1 po qam and 2 po in afternoon    Dispense:  90 tablet    Refill:  0  . amphetamine-dextroamphetamine (ADDERALL) 10 MG tablet    Sig: Take 1 tablet by mouth every morning and 2 tablets by mouth every afternoon    Dispense:  90 tablet    Refill:  0    Do not fill on or after July 2019  . amphetamine-dextroamphetamine (ADDERALL) 10 MG tablet    Sig: 1 po qam and 2 po in afternoon    Dispense:  90 tablet    Refill:  0    Do not fill until on or after 05/2018  . amphetamine-dextroamphetamine (ADDERALL) 10 MG tablet    Sig: 1 po qam and 2 po in afternoon    Dispense:  90 tablet    Refill:  0   CMA served as scribe during this visit. History, Physical and Plan performed by medical provider. Documentation and orders reviewed and attested to.  Ann Held, DO

## 2018-04-06 NOTE — Patient Instructions (Signed)

## 2018-05-10 ENCOUNTER — Other Ambulatory Visit: Payer: Self-pay | Admitting: Family Medicine

## 2018-05-10 DIAGNOSIS — R251 Tremor, unspecified: Secondary | ICD-10-CM

## 2018-05-10 MED ORDER — ALPRAZOLAM 0.25 MG PO TABS: 0.2500 mg | ORAL_TABLET | Freq: Three times a day (TID) | ORAL | 0 refills | Status: DC | PRN

## 2018-05-10 NOTE — Telephone Encounter (Signed)
Requesting:Alprazolam Contract:02/09/18 UDS:02/09/18 low risk Last Visit:04/06/18 Next Visit:10/27/18 Last Refill:04/01/17  No discrepancies, data base ran and placed on desk for review.   Please Advise

## 2018-07-17 ENCOUNTER — Other Ambulatory Visit: Payer: Self-pay | Admitting: Family Medicine

## 2018-07-17 DIAGNOSIS — R251 Tremor, unspecified: Secondary | ICD-10-CM

## 2018-07-17 DIAGNOSIS — F988 Other specified behavioral and emotional disorders with onset usually occurring in childhood and adolescence: Secondary | ICD-10-CM

## 2018-07-18 ENCOUNTER — Encounter: Payer: Self-pay | Admitting: Family Medicine

## 2018-07-18 MED ORDER — AMPHETAMINE-DEXTROAMPHETAMINE 10 MG PO TABS: ORAL_TABLET | ORAL | 0 refills | Status: DC

## 2018-07-18 MED ORDER — ALPRAZOLAM 0.25 MG PO TABS: 0.2500 mg | ORAL_TABLET | Freq: Three times a day (TID) | ORAL | 0 refills | Status: DC | PRN

## 2018-07-18 NOTE — Telephone Encounter (Signed)
Requesting: adderall 10mg  3 tabs qday  Contract: 2019 UDS: 02/09/18 Last OV: 04/06/18 Next Ov: 10/27/18 Last refill: 04/06/18, with "do not fill on or after July 2019", #90,0RF Database: no discrepancies found  Requesting: xanax 0.25mg  tid prn Contract: 2019  UDS: 02/09/18 Last OV: 04/06/18 Next Ov: 10/27/18 Last refill: 05/10/18, #30, 0RF Database: no discrepancies found  Please advise.

## 2018-09-13 ENCOUNTER — Encounter: Payer: BLUE CROSS/BLUE SHIELD | Admitting: Women's Health

## 2018-09-16 ENCOUNTER — Other Ambulatory Visit: Payer: Self-pay | Admitting: Family Medicine

## 2018-09-16 DIAGNOSIS — F988 Other specified behavioral and emotional disorders with onset usually occurring in childhood and adolescence: Secondary | ICD-10-CM

## 2018-09-18 ENCOUNTER — Encounter: Payer: Self-pay | Admitting: Family Medicine

## 2018-09-18 DIAGNOSIS — F988 Other specified behavioral and emotional disorders with onset usually occurring in childhood and adolescence: Secondary | ICD-10-CM

## 2018-09-19 MED ORDER — AMPHETAMINE-DEXTROAMPHETAMINE 10 MG PO TABS: ORAL_TABLET | ORAL | 0 refills | Status: DC

## 2018-09-19 NOTE — Telephone Encounter (Signed)
Pt is requesting refill on Adderall.   Last OV: 04/06/2018, appt scheduled 10/27/2018 Last Fill: 07/18/2018 #90 and 0RF UDS: 02/09/2018 Low risk

## 2018-09-25 ENCOUNTER — Ambulatory Visit (INDEPENDENT_AMBULATORY_CARE_PROVIDER_SITE_OTHER): Payer: BLUE CROSS/BLUE SHIELD | Admitting: Women's Health

## 2018-09-25 ENCOUNTER — Encounter: Payer: Self-pay | Admitting: Women's Health

## 2018-09-25 VITALS — BP 140/82 | Ht 66.0 in | Wt 158.0 lb

## 2018-09-25 DIAGNOSIS — Z01419 Encounter for gynecological examination (general) (routine) without abnormal findings: Secondary | ICD-10-CM | POA: Diagnosis not present

## 2018-09-25 NOTE — Patient Instructions (Signed)
Health Maintenance, Female Adopting a healthy lifestyle and getting preventive care can go a long way to promote health and wellness. Talk with your health care provider about what schedule of regular examinations is right for you. This is a good chance for you to check in with your provider about disease prevention and staying healthy. In between checkups, there are plenty of things you can do on your own. Experts have done a lot of research about which lifestyle changes and preventive measures are most likely to keep you healthy. Ask your health care provider for more information. Weight and diet Eat a healthy diet  Be sure to include plenty of vegetables, fruits, low-fat dairy products, and lean protein.  Do not eat a lot of foods high in solid fats, added sugars, or salt.  Get regular exercise. This is one of the most important things you can do for your health. ? Most adults should exercise for at least 150 minutes each week. The exercise should increase your heart rate and make you sweat (moderate-intensity exercise). ? Most adults should also do strengthening exercises at least twice a week. This is in addition to the moderate-intensity exercise.  Maintain a healthy weight  Body mass index (BMI) is a measurement that can be used to identify possible weight problems. It estimates body fat based on height and weight. Your health care provider can help determine your BMI and help you achieve or maintain a healthy weight.  For females 20 years of age and older: ? A BMI below 18.5 is considered underweight. ? A BMI of 18.5 to 24.9 is normal. ? A BMI of 25 to 29.9 is considered overweight. ? A BMI of 30 and above is considered obese.  Watch levels of cholesterol and blood lipids  You should start having your blood tested for lipids and cholesterol at 38 years of age, then have this test every 5 years.  You may need to have your cholesterol levels checked more often if: ? Your lipid or  cholesterol levels are high. ? You are older than 38 years of age. ? You are at high risk for heart disease.  Cancer screening Lung Cancer  Lung cancer screening is recommended for adults 55-80 years old who are at high risk for lung cancer because of a history of smoking.  A yearly low-dose CT scan of the lungs is recommended for people who: ? Currently smoke. ? Have quit within the past 15 years. ? Have at least a 30-pack-year history of smoking. A pack year is smoking an average of one pack of cigarettes a day for 1 year.  Yearly screening should continue until it has been 15 years since you quit.  Yearly screening should stop if you develop a health problem that would prevent you from having lung cancer treatment.  Breast Cancer  Practice breast self-awareness. This means understanding how your breasts normally appear and feel.  It also means doing regular breast self-exams. Let your health care provider know about any changes, no matter how small.  If you are in your 20s or 30s, you should have a clinical breast exam (CBE) by a health care provider every 1-3 years as part of a regular health exam.  If you are 40 or older, have a CBE every year. Also consider having a breast X-ray (mammogram) every year.  If you have a family history of breast cancer, talk to your health care provider about genetic screening.  If you are at high risk   for breast cancer, talk to your health care provider about having an MRI and a mammogram every year.  Breast cancer gene (BRCA) assessment is recommended for women who have family members with BRCA-related cancers. BRCA-related cancers include: ? Breast. ? Ovarian. ? Tubal. ? Peritoneal cancers.  Results of the assessment will determine the need for genetic counseling and BRCA1 and BRCA2 testing.  Cervical Cancer Your health care provider may recommend that you be screened regularly for cancer of the pelvic organs (ovaries, uterus, and  vagina). This screening involves a pelvic examination, including checking for microscopic changes to the surface of your cervix (Pap test). You may be encouraged to have this screening done every 3 years, beginning at age 22.  For women ages 56-65, health care providers may recommend pelvic exams and Pap testing every 3 years, or they may recommend the Pap and pelvic exam, combined with testing for human papilloma virus (HPV), every 5 years. Some types of HPV increase your risk of cervical cancer. Testing for HPV may also be done on women of any age with unclear Pap test results.  Other health care providers may not recommend any screening for nonpregnant women who are considered low risk for pelvic cancer and who do not have symptoms. Ask your health care provider if a screening pelvic exam is right for you.  If you have had past treatment for cervical cancer or a condition that could lead to cancer, you need Pap tests and screening for cancer for at least 20 years after your treatment. If Pap tests have been discontinued, your risk factors (such as having a new sexual partner) need to be reassessed to determine if screening should resume. Some women have medical problems that increase the chance of getting cervical cancer. In these cases, your health care provider may recommend more frequent screening and Pap tests.  Colorectal Cancer  This type of cancer can be detected and often prevented.  Routine colorectal cancer screening usually begins at 38 years of age and continues through 38 years of age.  Your health care provider may recommend screening at an earlier age if you have risk factors for colon cancer.  Your health care provider may also recommend using home test kits to check for hidden blood in the stool.  A small camera at the end of a tube can be used to examine your colon directly (sigmoidoscopy or colonoscopy). This is done to check for the earliest forms of colorectal  cancer.  Routine screening usually begins at age 33.  Direct examination of the colon should be repeated every 5-10 years through 38 years of age. However, you may need to be screened more often if early forms of precancerous polyps or small growths are found.  Skin Cancer  Check your skin from head to toe regularly.  Tell your health care provider about any new moles or changes in moles, especially if there is a change in a mole's shape or color.  Also tell your health care provider if you have a mole that is larger than the size of a pencil eraser.  Always use sunscreen. Apply sunscreen liberally and repeatedly throughout the day.  Protect yourself by wearing long sleeves, pants, a wide-brimmed hat, and sunglasses whenever you are outside.  Heart disease, diabetes, and high blood pressure  High blood pressure causes heart disease and increases the risk of stroke. High blood pressure is more likely to develop in: ? People who have blood pressure in the high end of  the normal range (130-139/85-89 mm Hg). ? People who are overweight or obese. ? People who are African American.  If you are 21-29 years of age, have your blood pressure checked every 3-5 years. If you are 3 years of age or older, have your blood pressure checked every year. You should have your blood pressure measured twice-once when you are at a hospital or clinic, and once when you are not at a hospital or clinic. Record the average of the two measurements. To check your blood pressure when you are not at a hospital or clinic, you can use: ? An automated blood pressure machine at a pharmacy. ? A home blood pressure monitor.  If you are between 17 years and 37 years old, ask your health care provider if you should take aspirin to prevent strokes.  Have regular diabetes screenings. This involves taking a blood sample to check your fasting blood sugar level. ? If you are at a normal weight and have a low risk for diabetes,  have this test once every three years after 38 years of age. ? If you are overweight and have a high risk for diabetes, consider being tested at a younger age or more often. Preventing infection Hepatitis B  If you have a higher risk for hepatitis B, you should be screened for this virus. You are considered at high risk for hepatitis B if: ? You were born in a country where hepatitis B is common. Ask your health care provider which countries are considered high risk. ? Your parents were born in a high-risk country, and you have not been immunized against hepatitis B (hepatitis B vaccine). ? You have HIV or AIDS. ? You use needles to inject street drugs. ? You live with someone who has hepatitis B. ? You have had sex with someone who has hepatitis B. ? You get hemodialysis treatment. ? You take certain medicines for conditions, including cancer, organ transplantation, and autoimmune conditions.  Hepatitis C  Blood testing is recommended for: ? Everyone born from [birth year] through 1966. ? Anyone with known risk factors for hepatitis C.  Sexually transmitted infections (STIs)  You should be screened for sexually transmitted infections (STIs) including gonorrhea and chlamydia if: ? You are sexually active and are younger than 38 years of age. ? You are older than 38 years of age and your health care provider tells you that you are at risk for this type of infection. ? Your sexual activity has changed since you were last screened and you are at an increased risk for chlamydia or gonorrhea. Ask your health care provider if you are at risk.  If you do not have HIV, but are at risk, it may be recommended that you take a prescription medicine daily to prevent HIV infection. This is called pre-exposure prophylaxis (PrEP). You are considered at risk if: ? You are sexually active and do not regularly use condoms or know the HIV status of your partner(s). ? You take drugs by injection. ? You are  sexually active with a partner who has HIV.  Talk with your health care provider about whether you are at high risk of being infected with HIV. If you choose to begin PrEP, you should first be tested for HIV. You should then be tested every 3 months for as long as you are taking PrEP. Pregnancy  If you are premenopausal and you may become pregnant, ask your health care provider about preconception counseling.  If you may become  pregnant, take 400 to 800 micrograms (mcg) of folic acid every day.  If you want to prevent pregnancy, talk to your health care provider about birth control (contraception). Osteoporosis and menopause  Osteoporosis is a disease in which the bones lose minerals and strength with aging. This can result in serious bone fractures. Your risk for osteoporosis can be identified using a bone density scan.  If you are 65 years of age or older, or if you are at risk for osteoporosis and fractures, ask your health care provider if you should be screened.  Ask your health care provider whether you should take a calcium or vitamin D supplement to lower your risk for osteoporosis.  Menopause may have certain physical symptoms and risks.  Hormone replacement therapy may reduce some of these symptoms and risks. Talk to your health care provider about whether hormone replacement therapy is right for you. Follow these instructions at home:  Schedule regular health, dental, and eye exams.  Stay current with your immunizations.  Do not use any tobacco products including cigarettes, chewing tobacco, or electronic cigarettes.  If you are pregnant, do not drink alcohol.  If you are breastfeeding, limit how much and how often you drink alcohol.  Limit alcohol intake to no more than 1 drink per day for nonpregnant women. One drink equals 12 ounces of beer, 5 ounces of wine, or 1 ounces of hard liquor.  Do not use street drugs.  Do not share needles.  Ask your health care  provider for help if you need support or information about quitting drugs.  Tell your health care provider if you often feel depressed.  Tell your health care provider if you have ever been abused or do not feel safe at home. This information is not intended to replace advice given to you by your health care provider. Make sure you discuss any questions you have with your health care provider. Document Released: 06/21/2011 Document Revised: 05/13/2016 Document Reviewed: 09/09/2015 Elsevier Interactive Patient Education  2018 [employer]  DASH Eating Plan DASH stands for "Dietary Approaches to Stop Hypertension." The DASH eating plan is a healthy eating plan that has been shown to reduce high blood pressure (hypertension). It may also reduce your risk for type 2 diabetes, heart disease, and stroke. The DASH eating plan may also help with weight loss. What are tips for following this plan? General guidelines  Avoid eating more than 2,300 mg (milligrams) of salt (sodium) a day. If you have hypertension, you may need to reduce your sodium intake to 1,500 mg a day.  Limit alcohol intake to no more than 1 drink a day for nonpregnant women and 2 drinks a day for men. One drink equals 12 oz of beer, 5 oz of wine, or 1 oz of hard liquor.  Work with your health care provider to maintain a healthy body weight or to lose weight. Ask what an ideal weight is for you.  Get at least 30 minutes of exercise that causes your heart to beat faster (aerobic exercise) most days of the week. Activities may include walking, swimming, or biking.  Work with your health care provider or diet and nutrition specialist (dietitian) to adjust your eating plan to your individual calorie needs. Reading food labels  Check food labels for the amount of sodium per serving. Choose foods with less than 5 percent of the Daily Value of sodium. Generally, foods with less than 300 mg of sodium per serving fit into this eating    plan.  To find whole grains, look for the word "whole" as the first word in the ingredient list. Shopping  Buy products labeled as "low-sodium" or "no salt added."  Buy fresh foods. Avoid canned foods and premade or frozen meals. Cooking  Avoid adding salt when cooking. Use salt-free seasonings or herbs instead of table salt or sea salt. Check with your health care provider or pharmacist before using salt substitutes.  Do not fry foods. Cook foods using healthy methods such as baking, boiling, grilling, and broiling instead.  Cook with heart-healthy oils, such as olive, canola, soybean, or sunflower oil. Meal planning   Eat a balanced diet that includes: ? 5 or more servings of fruits and vegetables each day. At each meal, try to fill half of your plate with fruits and vegetables. ? Up to 6-8 servings of whole grains each day. ? Less than 6 oz of lean meat, poultry, or fish each day. A 3-oz serving of meat is about the same size as a deck of cards. One egg equals 1 oz. ? 2 servings of low-fat dairy each day. ? A serving of nuts, seeds, or beans 5 times each week. ? Heart-healthy fats. Healthy fats called Omega-3 fatty acids are found in foods such as flaxseeds and coldwater fish, like sardines, salmon, and mackerel.  Limit how much you eat of the following: ? Canned or prepackaged foods. ? Food that is high in trans fat, such as fried foods. ? Food that is high in saturated fat, such as fatty meat. ? Sweets, desserts, sugary drinks, and other foods with added sugar. ? Full-fat dairy products.  Do not salt foods before eating.  Try to eat at least 2 vegetarian meals each week.  Eat more home-cooked food and less restaurant, buffet, and fast food.  When eating at a restaurant, ask that your food be prepared with less salt or no salt, if possible. What foods are recommended? The items listed may not be a complete list. Talk with your dietitian about what dietary choices are best  for you. Grains Whole-grain or whole-wheat bread. Whole-grain or whole-wheat pasta. Brown rice. Oatmeal. Quinoa. Bulgur. Whole-grain and low-sodium cereals. Pita bread. Low-fat, low-sodium crackers. Whole-wheat flour tortillas. Vegetables Fresh or frozen vegetables (raw, steamed, roasted, or grilled). Low-sodium or reduced-sodium tomato and vegetable juice. Low-sodium or reduced-sodium tomato sauce and tomato paste. Low-sodium or reduced-sodium canned vegetables. Fruits All fresh, dried, or frozen fruit. Canned fruit in natural juice (without added sugar). Meat and other protein foods Skinless chicken or turkey. Ground chicken or turkey. Pork with fat trimmed off. Fish and seafood. Egg whites. Dried beans, peas, or lentils. Unsalted nuts, nut butters, and seeds. Unsalted canned beans. Lean cuts of beef with fat trimmed off. Low-sodium, lean deli meat. Dairy Low-fat (1%) or fat-free (skim) milk. Fat-free, low-fat, or reduced-fat cheeses. Nonfat, low-sodium ricotta or cottage cheese. Low-fat or nonfat yogurt. Low-fat, low-sodium cheese. Fats and oils Soft margarine without trans fats. Vegetable oil. Low-fat, reduced-fat, or light mayonnaise and salad dressings (reduced-sodium). Canola, safflower, olive, soybean, and sunflower oils. Avocado. Seasoning and other foods Herbs. Spices. Seasoning mixes without salt. Unsalted popcorn and pretzels. Fat-free sweets. What foods are not recommended? The items listed may not be a complete list. Talk with your dietitian about what dietary choices are best for you. Grains Baked goods made with fat, such as croissants, muffins, or some breads. Dry pasta or rice meal packs. Vegetables Creamed or fried vegetables. Vegetables in a cheese sauce. Regular canned   vegetables (not low-sodium or reduced-sodium). Regular canned tomato sauce and paste (not low-sodium or reduced-sodium). Regular tomato and vegetable juice (not low-sodium or reduced-sodium). Pickles.  Olives. Fruits Canned fruit in a light or heavy syrup. Fried fruit. Fruit in cream or butter sauce. Meat and other protein foods Fatty cuts of meat. Ribs. Fried meat. Bacon. Sausage. Bologna and other processed lunch meats. Salami. Fatback. Hotdogs. Bratwurst. Salted nuts and seeds. Canned beans with added salt. Canned or smoked fish. Whole eggs or egg yolks. Chicken or turkey with skin. Dairy Whole or 2% milk, cream, and half-and-half. Whole or full-fat cream cheese. Whole-fat or sweetened yogurt. Full-fat cheese. Nondairy creamers. Whipped toppings. Processed cheese and cheese spreads. Fats and oils Butter. Stick margarine. Lard. Shortening. Ghee. Bacon fat. Tropical oils, such as coconut, palm kernel, or palm oil. Seasoning and other foods Salted popcorn and pretzels. Onion salt, garlic salt, seasoned salt, table salt, and sea salt. Worcestershire sauce. Tartar sauce. Barbecue sauce. Teriyaki sauce. Soy sauce, including reduced-sodium. Steak sauce. Canned and packaged gravies. Fish sauce. Oyster sauce. Cocktail sauce. Horseradish that you find on the shelf. Ketchup. Mustard. Meat flavorings and tenderizers. Bouillon cubes. Hot sauce and Tabasco sauce. Premade or packaged marinades. Premade or packaged taco seasonings. Relishes. Regular salad dressings. Where to find more information:  National Heart, Lung, and Blood Institute: www.nhlbi.nih.gov  American Heart Association: www.heart.org Summary  The DASH eating plan is a healthy eating plan that has been shown to reduce high blood pressure (hypertension). It may also reduce your risk for type 2 diabetes, heart disease, and stroke.  With the DASH eating plan, you should limit salt (sodium) intake to 2,300 mg a day. If you have hypertension, you may need to reduce your sodium intake to 1,500 mg a day.  When on the DASH eating plan, aim to eat more fresh fruits and vegetables, whole grains, lean proteins, low-fat dairy, and heart-healthy  fats.  Work with your health care provider or diet and nutrition specialist (dietitian) to adjust your eating plan to your individual calorie needs. This information is not intended to replace advice given to you by your health care provider. Make sure you discuss any questions you have with your health care provider. Document Released: 11/25/2011 Document Revised: 11/29/2016 Document Reviewed: 11/29/2016 Elsevier Interactive Patient Education  2018 Elsevier Inc.  

## 2018-09-25 NOTE — Progress Notes (Signed)
Destiny Rangel May 20, 1981 [id number]    History:    Presents for annual exam.  Monthly cycle.  Was on Norethindrone with no complaints but stopped April 2019.  Currently smoking 1x a week.  Same partner, recently engaged.  Recent health screening at work and reports cholesterol is WNL and BP is lower than reported in clinic.  Will scan clinical labs into mychart from work.    Past medical history, past surgical history, family history and social history were all reviewed and documented in the EPIC chart. Sleeping and eating well. Weight has increased but will get more cardio in.  Taking a multivitamin daily.  Anticipating pregnancy within the next year. Works at [employer] for [employer], recently traveled to [place] for work x 1 month.    ROS:  A ROS was performed and pertinent positives and negatives are included.  Exam:  Vitals:   09/25/18 0907  BP: 140/82  Weight: 158 lb (71.7 kg)  Height: 5\' 6"  (1.676 m)   Body mass index is 25.5 kg/m.   General appearance:  Normal Thyroid:  Symmetrical, normal in size, without palpable masses or nodularity. Respiratory  Auscultation:  Clear without wheezing or rhonchi Cardiovascular  Auscultation:  Regular rate, without rubs, murmurs or gallops  Edema/varicosities:  Not grossly evident Abdominal  Soft,nontender, without masses, guarding or rebound.  Liver/spleen:  No organomegaly noted  Hernia:  None appreciated  Skin  Inspection:  Grossly normal   Breasts: Examined lying and sitting.     Right: Without masses, retractions, discharge or axillary adenopathy.     Left: Without masses, retractions, discharge or axillary adenopathy. Gentitourinary   Inguinal/mons:  Normal without inguinal adenopathy  External genitalia:  Normal  BUS/Urethra/Skene's glands:  Normal  Vagina:  Normal  Cervix:  Normal  Uterus:  normal in size, shape and contour.  Midline and mobile  Adnexa/parametria:     Rt: Without masses or tenderness.   Lt: Without  masses or tenderness.  Anus and perineum: Normal    Assessment/Plan:  37 y.o.  SWF G0  for annual exam with no complaints.   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  Preconceptional counseling/fertility discussed, planning marriage in the next year pregnancy after.  Discussed importance of no smoking.  Continue to monitor BP and weight management efforts. Increase water intake.  Has gained 15 pounds in the past year reviewed importance of decreasing simple carbs and calories.  Reports blood pressure less than 130/80 when taken away from office and at primary care.  Limit caffeine-diet discussed, avoid processed foods and added salt.  SBE's, annual mammogram at 40.  Pap with HR HPV typing, new screening guidelines reviewed.  [facility] [place], 9:14 AM 09/25/2018

## 2018-09-27 LAB — PAP, TP IMAGING W/ HPV RNA, RFLX HPV TYPE 16,18/45: HPV DNA High Risk: NOT DETECTED

## 2018-10-27 ENCOUNTER — Ambulatory Visit (INDEPENDENT_AMBULATORY_CARE_PROVIDER_SITE_OTHER): Payer: BLUE CROSS/BLUE SHIELD | Admitting: Family Medicine

## 2018-10-27 ENCOUNTER — Encounter: Payer: Self-pay | Admitting: Family Medicine

## 2018-10-27 VITALS — BP 134/91 | HR 78 | Temp 98.6°F | Resp 16 | Ht 66.0 in | Wt 161.0 lb

## 2018-10-27 DIAGNOSIS — Z0000 Encounter for general adult medical examination without abnormal findings: Secondary | ICD-10-CM

## 2018-10-27 DIAGNOSIS — R21 Rash and other nonspecific skin eruption: Secondary | ICD-10-CM | POA: Diagnosis not present

## 2018-10-27 DIAGNOSIS — F988 Other specified behavioral and emotional disorders with onset usually occurring in childhood and adolescence: Secondary | ICD-10-CM

## 2018-10-27 LAB — COMPREHENSIVE METABOLIC PANEL
ALBUMIN: 4.6 g/dL (ref 3.5–5.2)
ALT: 22 U/L (ref 0–35)
AST: 23 U/L (ref 0–37)
Alkaline Phosphatase: 45 U/L (ref 39–117)
BUN: 12 mg/dL (ref 6–23)
CALCIUM: 9.4 mg/dL (ref 8.4–10.5)
CO2: 26 mEq/L (ref 19–32)
CREATININE: 0.59 mg/dL (ref 0.40–1.20)
Chloride: 102 mEq/L (ref 96–112)
GFR: 121.01 mL/min (ref >60.00)
Glucose, Bld: 74 mg/dL (ref 70–99)
Potassium: 4.2 mEq/L (ref 3.5–5.1)
SODIUM: 135 meq/L (ref 135–145)
TOTAL PROTEIN: 7.3 g/dL (ref 6.0–8.3)
Total Bilirubin: 0.5 mg/dL (ref 0.2–1.2)

## 2018-10-27 LAB — LIPID PANEL
CHOLESTEROL: 180 mg/dL (ref 0–200)
HDL: 78.9 mg/dL (ref >39.00)
LDL Cholesterol: 90 mg/dL (ref 0–99)
NonHDL: 100.95
TRIGLYCERIDES: 57 mg/dL (ref 0.0–149.0)
Total CHOL/HDL Ratio: 2
VLDL: 11.4 mg/dL (ref 0.0–40.0)

## 2018-10-27 LAB — CBC WITH DIFFERENTIAL/PLATELET
Basophils Absolute: 0 10*3/uL (ref 0.0–0.1)
Basophils Relative: 0.4 % (ref 0.0–3.0)
EOS ABS: 0.1 10*3/uL (ref 0.0–0.7)
EOS PCT: 1.3 % (ref 0.0–5.0)
HCT: 40.8 % (ref 36.0–46.0)
HEMOGLOBIN: 13.9 g/dL (ref 12.0–15.0)
LYMPHS ABS: 2.4 10*3/uL (ref 0.7–4.0)
Lymphocytes Relative: 30.4 % (ref 12.0–46.0)
MCHC: 34.2 g/dL (ref 30.0–36.0)
MCV: 94.7 fl (ref 78.0–100.0)
MONO ABS: 0.7 10*3/uL (ref 0.1–1.0)
Monocytes Relative: 8.4 % (ref 3.0–12.0)
NEUTROS PCT: 59.5 % (ref 43.0–77.0)
Neutro Abs: 4.7 10*3/uL (ref 1.4–7.7)
Platelets: 282 10*3/uL (ref 150.0–400.0)
RBC: 4.31 Mil/uL (ref 3.87–5.11)
RDW: 13.4 % (ref 11.5–15.5)
WBC: 7.9 10*3/uL (ref 4.0–10.5)

## 2018-10-27 LAB — TSH: TSH: 3.15 u[IU]/mL (ref 0.35–4.50)

## 2018-10-27 MED ORDER — AMPHETAMINE-DEXTROAMPHETAMINE 10 MG PO TABS: ORAL_TABLET | ORAL | 0 refills | Status: DC

## 2018-10-27 MED ORDER — CLOTRIMAZOLE-BETAMETHASONE 1-0.05 % EX CREA: 1.0000 "application " | TOPICAL_CREAM | Freq: Two times a day (BID) | CUTANEOUS | 0 refills | Status: DC

## 2018-10-27 NOTE — Patient Instructions (Signed)
Preventive Care 18-39 Years, Female Preventive care refers to lifestyle choices and visits with your health care provider that can promote health and wellness. What does preventive care include?  A yearly physical exam. This is also called an annual well check.  Dental exams once or twice a year.  Routine eye exams. Ask your health care provider how often you should have your eyes checked.  Personal lifestyle choices, including: ? Daily care of your teeth and gums. ? Regular physical activity. ? Eating a healthy diet. ? Avoiding tobacco and drug use. ? Limiting alcohol use. ? Practicing safe sex. ? Taking vitamin and mineral supplements as recommended by your health care provider. What happens during an annual well check? The services and screenings done by your health care provider during your annual well check will depend on your age, overall health, lifestyle risk factors, and family history of disease. Counseling Your health care provider may ask you questions about your:  Alcohol use.  Tobacco use.  Drug use.  Emotional well-being.  Home and relationship well-being.  Sexual activity.  Eating habits.  Work and work Statistician.  Method of birth control.  Menstrual cycle.  Pregnancy history.  Screening You may have the following tests or measurements:  Height, weight, and BMI.  Diabetes screening. This is done by checking your blood sugar (glucose) after you have not eaten for a while (fasting).  Blood pressure.  Lipid and cholesterol levels. These may be checked every 5 years starting at age 66.  Skin check.  Hepatitis C blood test.  Hepatitis B blood test.  Sexually transmitted disease (STD) testing.  BRCA-related cancer screening. This may be done if you have a family history of breast, ovarian, tubal, or peritoneal cancers.  Pelvic exam and Pap test. This may be done every 3 years starting at age 40. Starting at age 59, this may be done every 5  years if you have a Pap test in combination with an HPV test.  Discuss your test results, treatment options, and if necessary, the need for more tests with your health care provider. Vaccines Your health care provider may recommend certain vaccines, such as:  Influenza vaccine. This is recommended every year.  Tetanus, diphtheria, and acellular pertussis (Tdap, Td) vaccine. You may need a Td booster every 10 years.  Varicella vaccine. You may need this if you have not been vaccinated.  HPV vaccine. If you are 69 or younger, you may need three doses over 6 months.  Measles, mumps, and rubella (MMR) vaccine. You may need at least one dose of MMR. You may also need a second dose.  Pneumococcal 13-valent conjugate (PCV13) vaccine. You may need this if you have certain conditions and were not previously vaccinated.  Pneumococcal polysaccharide (PPSV23) vaccine. You may need one or two doses if you smoke cigarettes or if you have certain conditions.  Meningococcal vaccine. One dose is recommended if you are age 27-21 years and a first-year college student living in a residence hall, or if you have one of several medical conditions. You may also need additional booster doses.  Hepatitis A vaccine. You may need this if you have certain conditions or if you travel or work in places where you may be exposed to hepatitis A.  Hepatitis B vaccine. You may need this if you have certain conditions or if you travel or work in places where you may be exposed to hepatitis B.  Haemophilus influenzae type b (Hib) vaccine. You may need this if  you have certain risk factors.  Talk to your health care provider about which screenings and vaccines you need and how often you need them. This information is not intended to replace advice given to you by your health care provider. Make sure you discuss any questions you have with your health care provider. Document Released: 02/01/2002 Document Revised: 08/25/2016  Document Reviewed: 10/07/2015 Elsevier Interactive Patient Education  [employer].

## 2018-10-27 NOTE — Progress Notes (Signed)
Subjective:     Destiny Rangel is a 37 y.o. female and is here for a comprehensive physical exam. The patient reports no problems.  Social History   Socioeconomic History  . Marital status: Single    Spouse name: Not on file  . Number of children: Not on file  . Years of education: Not on file  . Highest education level: Not on file  Occupational History  . Not on file  Social Needs  . Financial resource strain: Not on file  . Food insecurity:    Worry: Not on file    Inability: Not on file  . Transportation needs:    Medical: Not on file    Non-medical: Not on file  Tobacco Use  . Smoking status: Current Some Day Smoker    Types: Cigarettes  . Smokeless tobacco: Never Used  Substance and Sexual Activity  . Alcohol use: Yes    Comment: socially  . Drug use: No  . Sexual activity: Yes    Partners: Male    Birth control/protection: Condom    Comment: INTERCOURSE AGE 35, MORE THAN 5  Lifestyle  . Physical activity:    Days per week: Not on file    Minutes per session: Not on file  . Stress: Not on file  Relationships  . Social connections:    Talks on phone: Not on file    Gets together: Not on file    Attends religious service: Not on file    Active member of club or organization: Not on file    Attends meetings of clubs or organizations: Not on file    Relationship status: Not on file  . Intimate partner violence:    Fear of current or ex partner: Not on file    Emotionally abused: Not on file    Physically abused: Not on file    Forced sexual activity: Not on file  Other Topics Concern  . Not on file  Social History Narrative  . Not on file   Health Maintenance  Topic Date Due  . PAP SMEAR  09/25/2021  . TETANUS/TDAP  05/21/2024  . INFLUENZA VACCINE  Completed  . HIV Screening  Completed    The following portions of the patient's history were reviewed and updated as appropriate: She  has a past medical history of ADHD (attention deficit hyperactivity  disorder) and Allergy. She does not have any pertinent problems on file. She  has a past surgical history that includes Wisdom tooth extraction and Cervical conization w/bx (N/A, 12/02/2015). Her family history includes Alcohol abuse in her unknown relative; Cancer in her father, maternal grandfather, and mother; Depression in her unknown relative; Parkinsonism in her maternal grandmother. She  reports that she has been smoking cigarettes. She has never used smokeless tobacco. She reports that she drinks alcohol. She reports that she does not use drugs. She has a current medication list which includes the following prescription(s): alprazolam and amphetamine-dextroamphetamine. Current Outpatient Medications on File Prior to Visit  Medication Sig Dispense Refill  . ALPRAZolam (XANAX) 0.25 MG tablet Take 1 tablet (0.25 mg total) by mouth 3 (three) times daily as needed. 30 tablet 0  . amphetamine-dextroamphetamine (ADDERALL) 10 MG tablet Take 1 tablet by mouth every morning and 2 tablets by mouth every afternoon 90 tablet 0   No current facility-administered medications on file prior to visit.    She has No Known Allergies..  Review of Systems Review of Systems  Constitutional: Negative for activity change,  appetite change and fatigue.  HENT: Negative for hearing loss, congestion, tinnitus and ear discharge.  dentist q77m Eyes: Negative for visual disturbance (see optho q1y -- vision corrected to 20/20 with glasses).  Respiratory: Negative for cough, chest tightness and shortness of breath.   Cardiovascular: Negative for chest pain, palpitations and leg swelling.  Gastrointestinal: Negative for abdominal pain, diarrhea, constipation and abdominal distention.  Genitourinary: Negative for urgency, frequency, decreased urine volume and difficulty urinating.  Musculoskeletal: Negative for back pain, arthralgias and gait problem.  Skin: Negative for color change, pallor and rash.  Neurological:  Negative for dizziness, light-headedness, numbness and headaches.  Hematological: Negative for adenopathy. Does not bruise/bleed easily.  Psychiatric/Behavioral: Negative for suicidal ideas, confusion, sleep disturbance, self-injury, dysphoric mood, decreased concentration and agitation.       Objective:    BP (!) 134/91 (BP Location: Right Arm, Patient Position: Sitting, Cuff Size: Small)   Pulse 78   Temp 98.6 F (37 C) (Oral)   Resp 16   Ht 5\' 6"  (1.676 m)   Wt 161 lb (73 kg)   SpO2 100%   BMI 25.99 kg/m  General appearance: alert, cooperative, appears stated age and no distress Head: Normocephalic, without obvious abnormality, atraumatic Eyes: conjunctivae/corneas clear. PERRL, EOM's intact. Fundi benign. Ears: normal TM's and external ear canals both ears Nose: Nares normal. Septum midline. Mucosa normal. No drainage or sinus tenderness. Throat: lips, mucosa, and tongue normal; teeth and gums normal Neck: no adenopathy, no carotid bruit, no JVD, supple, symmetrical, trachea midline and thyroid not enlarged, symmetric, no tenderness/mass/nodules Back: symmetric, no curvature. ROM normal. No CVA tenderness. Lungs: clear to auscultation bilaterally Breasts: gyn Heart: regular rate and rhythm, S1, S2 normal, no murmur, click, rub or gallop Abdomen: soft, non-tender; bowel sounds normal; no masses,  no organomegaly Pelvic: deferred--gyn Extremities: extremities normal, atraumatic, no cyanosis or edema Pulses: 2+ and symmetric Skin: Skin color, texture, turgor normal. No rashes or lesions Lymph nodes: Cervical, supraclavicular, and axillary nodes normal. Neurologic: Alert and oriented X 3, normal strength and tone. Normal symmetric reflexes. Normal coordination and gait    Assessment:    Healthy female exam.      Plan:    ghm utd Check labs  See After Visit Summary for Counseling Recommendations    1. Attention deficit disorder (ADD) without hyperactivity Stable-  refill meds  - amphetamine-dextroamphetamine (ADDERALL) 10 MG tablet; Take 1 tablet by mouth every morning and 2 tablets by mouth every afternoon  Dispense: 90 tablet; Refill: 0 - amphetamine-dextroamphetamine (ADDERALL) 10 MG tablet; 1 po q am and 1 po q afternoon  Dispense: 90 tablet; Refill: 0 - amphetamine-dextroamphetamine (ADDERALL) 10 MG tablet; 1 po q am and 2 po q afternoon  Dispense: 90 tablet; Refill: 0  2. Rash If no better in 10-14 days - clotrimazole-betamethasone (LOTRISONE) cream; Apply 1 application topically 2 (two) times daily.  Dispense: 30 g; Refill: 0  3. Preventative health care ghm utd check labs  - CBC with Differential/Platelet - Lipid panel - Comprehensive metabolic panel - TSH

## 2018-11-14 ENCOUNTER — Encounter: Payer: Self-pay | Admitting: Family Medicine

## 2018-11-14 DIAGNOSIS — R21 Rash and other nonspecific skin eruption: Secondary | ICD-10-CM

## 2018-11-14 NOTE — Telephone Encounter (Signed)
If it has not completely cleared -- we could try something stronger for no more than 2 weeks or we can refer to derm----  What is her preference?

## 2018-11-21 NOTE — Telephone Encounter (Signed)
Ok to refer to derm?

## 2018-12-15 DIAGNOSIS — L718 Other rosacea: Secondary | ICD-10-CM | POA: Diagnosis not present

## 2018-12-15 DIAGNOSIS — D2239 Melanocytic nevi of other parts of face: Secondary | ICD-10-CM | POA: Diagnosis not present

## 2018-12-15 DIAGNOSIS — L300 Nummular dermatitis: Secondary | ICD-10-CM | POA: Diagnosis not present

## 2019-01-05 ENCOUNTER — Telehealth: Payer: Self-pay | Admitting: *Deleted

## 2019-01-05 NOTE — Telephone Encounter (Signed)
Pharmacy [facility] sent a fax stating that one of the adderall rx sig was wrong can you resend in one rx for 1 tab in the am and 2 tabs in the afternoon.

## 2019-01-06 ENCOUNTER — Other Ambulatory Visit: Payer: Self-pay | Admitting: Family Medicine

## 2019-01-06 DIAGNOSIS — F988 Other specified behavioral and emotional disorders with onset usually occurring in childhood and adolescence: Secondary | ICD-10-CM

## 2019-01-06 MED ORDER — AMPHETAMINE-DEXTROAMPHETAMINE 10 MG PO TABS: ORAL_TABLET | ORAL | 0 refills | Status: DC

## 2019-01-06 NOTE — Telephone Encounter (Signed)
done

## 2019-02-05 ENCOUNTER — Other Ambulatory Visit: Payer: Self-pay | Admitting: Family Medicine

## 2019-02-05 ENCOUNTER — Other Ambulatory Visit: Payer: Self-pay

## 2019-02-05 ENCOUNTER — Encounter: Payer: Self-pay | Admitting: Family Medicine

## 2019-02-05 DIAGNOSIS — F988 Other specified behavioral and emotional disorders with onset usually occurring in childhood and adolescence: Secondary | ICD-10-CM

## 2019-02-05 MED ORDER — AMPHETAMINE-DEXTROAMPHETAMINE 10 MG PO TABS: ORAL_TABLET | ORAL | 0 refills | Status: DC

## 2019-02-05 NOTE — Telephone Encounter (Signed)
Sent March, April , May

## 2019-02-05 NOTE — Telephone Encounter (Signed)
Pt is requesting refill on Adderall.   Last OV: 10/27/2018 Last Fill: 01/06/2019 #90 and 0RF UDS: 02/09/2018 Low risk

## 2019-04-30 ENCOUNTER — Encounter: Payer: Self-pay | Admitting: Family Medicine

## 2019-04-30 ENCOUNTER — Ambulatory Visit (INDEPENDENT_AMBULATORY_CARE_PROVIDER_SITE_OTHER): Payer: BLUE CROSS/BLUE SHIELD | Admitting: Family Medicine

## 2019-04-30 ENCOUNTER — Telehealth: Payer: Self-pay | Admitting: Family Medicine

## 2019-04-30 ENCOUNTER — Other Ambulatory Visit: Payer: Self-pay

## 2019-04-30 DIAGNOSIS — F988 Other specified behavioral and emotional disorders with onset usually occurring in childhood and adolescence: Secondary | ICD-10-CM | POA: Diagnosis not present

## 2019-04-30 DIAGNOSIS — J302 Other seasonal allergic rhinitis: Secondary | ICD-10-CM | POA: Diagnosis not present

## 2019-04-30 MED ORDER — FLUTICASONE PROPIONATE 50 MCG/ACT NA SUSP: 2.0000 | Freq: Every day | NASAL | 6 refills | Status: DC

## 2019-04-30 MED ORDER — LEVOCETIRIZINE DIHYDROCHLORIDE 5 MG PO TABS: 5.0000 mg | ORAL_TABLET | Freq: Every evening | ORAL | 5 refills | Status: DC

## 2019-04-30 MED ORDER — AMPHETAMINE-DEXTROAMPHETAMINE 10 MG PO TABS: ORAL_TABLET | ORAL | 0 refills | Status: DC

## 2019-04-30 NOTE — Telephone Encounter (Signed)
LVM for pt to call and schedule pt for a 6 month fu appt.

## 2019-04-30 NOTE — Progress Notes (Signed)
Virtual Visit via Video Note  I connected with Destiny Rangel on 04/30/19 at 10:00 AM EDT by a video enabled telemedicine application and verified that I am speaking with the correct person using two identifiers.  Location: Patient: home  Provider: home    I discussed the limitations of evaluation and management by telemedicine and the availability of in person appointments. The patient expressed understanding and agreed to proceed.  History of Present Illness: Pt is home c/o allergy symptoms.  She would like something other than claritin for her symptoms.   She also needs a refill of her add meds    Observations/Objective: Afebrile ,  Pt in NAD No vitals obtained  Pt sneezing, sniffling -- runny nose   Assessment and Plan: 1. Attention deficit disorder (ADD) without hyperactivity Stable No side effects---  Pt still has May at the pharmacy rto 6 months - amphetamine-dextroamphetamine (ADDERALL) 10 MG tablet; 1 po qam and 2 po qpm  Dispense: 90 tablet; Refill: 0 - amphetamine-dextroamphetamine (ADDERALL) 10 MG tablet; 1 po qam and 2 po q pm  Dispense: 90 tablet; Refill: 0 - amphetamine-dextroamphetamine (ADDERALL) 10 MG tablet; Take 1 tablet by mouth every morning and 2 tablets by mouth every afternoon  Dispense: 90 tablet; Refill: 0  2. Seasonal allergies Change to zyzal and start flonase Call prn  - fluticasone (FLONASE) 50 MCG/ACT nasal spray; Place 2 sprays into both nostrils daily.  Dispense: 16 g; Refill: 6 - levocetirizine (XYZAL) 5 MG tablet; Take 1 tablet (5 mg total) by mouth every evening.  Dispense: 30 tablet; Refill: 5   Follow Up Instructions:    I discussed the assessment and treatment plan with the patient. The patient was provided an opportunity to ask questions and all were answered. The patient agreed with the plan and demonstrated an understanding of the instructions.   The patient was advised to call back or seek an in-person evaluation if the symptoms worsen  or if the condition fails to improve as anticipated.  I provided 20 minutes of non-face-to-face time during this encounter.   Donato Schultz, DO

## 2019-09-19 ENCOUNTER — Other Ambulatory Visit: Payer: Self-pay | Admitting: Family Medicine

## 2019-09-19 ENCOUNTER — Encounter: Payer: Self-pay | Admitting: Family Medicine

## 2019-09-19 DIAGNOSIS — F988 Other specified behavioral and emotional disorders with onset usually occurring in childhood and adolescence: Secondary | ICD-10-CM

## 2019-09-19 NOTE — Telephone Encounter (Signed)
Requesting: Adderall Contract: 02/09/2018 UDS: 02/09/2018, low risk, 08/09/2018  Last OV: 04/30/2019 Next OV: N/A Last Refill: 04/30/2019, #90--0 RF Database:   Please advise

## 2019-09-21 ENCOUNTER — Encounter: Payer: Self-pay | Admitting: Women's Health

## 2019-10-01 ENCOUNTER — Encounter: Payer: Self-pay | Admitting: Women's Health

## 2019-10-01 ENCOUNTER — Other Ambulatory Visit: Payer: Self-pay

## 2019-10-01 ENCOUNTER — Ambulatory Visit (INDEPENDENT_AMBULATORY_CARE_PROVIDER_SITE_OTHER): Payer: BC Managed Care – PPO | Admitting: Women's Health

## 2019-10-01 VITALS — BP 110/78 | Ht 66.0 in | Wt 154.0 lb

## 2019-10-01 DIAGNOSIS — Z01419 Encounter for gynecological examination (general) (routine) without abnormal findings: Secondary | ICD-10-CM | POA: Diagnosis not present

## 2019-10-01 DIAGNOSIS — Z23 Encounter for immunization: Secondary | ICD-10-CM | POA: Diagnosis not present

## 2019-10-01 DIAGNOSIS — Z8742 Personal history of other diseases of the female genital tract: Secondary | ICD-10-CM

## 2019-10-01 MED ORDER — BETAMETHASONE VALERATE 0.1 % EX OINT: 1.0000 "application " | TOPICAL_OINTMENT | Freq: Two times a day (BID) | CUTANEOUS | 0 refills | Status: DC

## 2019-10-01 NOTE — Patient Instructions (Signed)
Mammogram at breast center at 10 (or health screening at work)   Health Maintenance, Female Adopting a healthy lifestyle and getting preventive care are important in promoting health and wellness. Ask your health care provider about:  The right schedule for you to have regular tests and exams.  Things you can do on your own to prevent diseases and keep yourself healthy. What should I know about diet, weight, and exercise? Eat a healthy diet   Eat a diet that includes plenty of vegetables, fruits, low-fat dairy products, and lean protein.  Do not eat a lot of foods that are high in solid fats, added sugars, or sodium. Maintain a healthy weight Body mass index (BMI) is used to identify weight problems. It estimates body fat based on height and weight. Your health care provider can help determine your BMI and help you achieve or maintain a healthy weight. Get regular exercise Get regular exercise. This is one of the most important things you can do for your health. Most adults should:  Exercise for at least 150 minutes each week. The exercise should increase your heart rate and make you sweat (moderate-intensity exercise).  Do strengthening exercises at least twice a week. This is in addition to the moderate-intensity exercise.  Spend less time sitting. Even light physical activity can be beneficial. Watch cholesterol and blood lipids Have your blood tested for lipids and cholesterol at 39 years of age, then have this test every 5 years. Have your cholesterol levels checked more often if:  Your lipid or cholesterol levels are high.  You are older than 39 years of age.  You are at high risk for heart disease. What should I know about cancer screening? Depending on your health history and family history, you may need to have cancer screening at various ages. This may include screening for:  Breast cancer.  Cervical cancer.  Colorectal cancer.  Skin cancer.  Lung cancer. What  should I know about heart disease, diabetes, and high blood pressure? Blood pressure and heart disease  High blood pressure causes heart disease and increases the risk of stroke. This is more likely to develop in people who have high blood pressure readings, are of African descent, or are overweight.  Have your blood pressure checked: ? Every 3-5 years if you are 54-47 years of age. ? Every year if you are 73 years old or older. Diabetes Have regular diabetes screenings. This checks your fasting blood sugar level. Have the screening done:  Once every three years after age 39 if you are at a normal weight and have a low risk for diabetes.  More often and at a younger age if you are overweight or have a high risk for diabetes. What should I know about preventing infection? Hepatitis B If you have a higher risk for hepatitis B, you should be screened for this virus. Talk with your health care provider to find out if you are at risk for hepatitis B infection. Hepatitis C Testing is recommended for:  Everyone born from [birth year] through 1966.  Anyone with known risk factors for hepatitis C. Sexually transmitted infections (STIs)  Get screened for STIs, including gonorrhea and chlamydia, if: ? You are sexually active and are younger than 39 years of age. ? You are older than 39 years of age and your health care provider tells you that you are at risk for this type of infection. ? Your sexual activity has changed since you were last screened, and you are  at increased risk for chlamydia or gonorrhea. Ask your health care provider if you are at risk.  Ask your health care provider about whether you are at high risk for HIV. Your health care provider may recommend a prescription medicine to help prevent HIV infection. If you choose to take medicine to prevent HIV, you should first get tested for HIV. You should then be tested every 3 months for as long as you are taking the medicine. Pregnancy  If  you are about to stop having your period (premenopausal) and you may become pregnant, seek counseling before you get pregnant.  Take 400 to 800 micrograms (mcg) of folic acid every day if you become pregnant.  Ask for birth control (contraception) if you want to prevent pregnancy. Osteoporosis and menopause Osteoporosis is a disease in which the bones lose minerals and strength with aging. This can result in bone fractures. If you are 92 years old or older, or if you are at risk for osteoporosis and fractures, ask your health care provider if you should:  Be screened for bone loss.  Take a calcium or vitamin D supplement to lower your risk of fractures.  Be given hormone replacement therapy (HRT) to treat symptoms of menopause. Follow these instructions at home: Lifestyle  Do not use any products that contain nicotine or tobacco, such as cigarettes, e-cigarettes, and chewing tobacco. If you need help quitting, ask your health care provider.  Do not use street drugs.  Do not share needles.  Ask your health care provider for help if you need support or information about quitting drugs. Alcohol use  Do not drink alcohol if: ? Your health care provider tells you not to drink. ? You are pregnant, may be pregnant, or are planning to become pregnant.  If you drink alcohol: ? Limit how much you use to 0-1 drink a day. ? Limit intake if you are breastfeeding.  Be aware of how much alcohol is in your drink. In the [place] one drink equals one 12 oz bottle of beer (355 mL), one 5 oz glass of wine (148 mL), or one 1 oz glass of hard liquor (44 mL). General instructions  Schedule regular health, dental, and eye exams.  Stay current with your vaccines.  Tell your health care provider if: ? You often feel depressed. ? You have ever been abused or do not feel safe at home. Summary  Adopting a healthy lifestyle and getting preventive care are important in promoting health and wellness.   Follow your health care provider's instructions about healthy diet, exercising, and getting tested or screened for diseases.  Follow your health care provider's instructions on monitoring your cholesterol and blood pressure. This information is not intended to replace advice given to you by your health care provider. Make sure you discuss any questions you have with your health care provider. Document Released: 06/21/2011 Document Revised: 11/29/2018 Document Reviewed: 11/29/2018 Elsevier Patient Education  [employer].   [phone]

## 2019-10-01 NOTE — Progress Notes (Signed)
Destiny Rangel [birth year] [id number]    History:    38 y.o. G0 presents for annual exam. Monthly cycle/condoms. Stopped Norethindrone April 2019, no complaints with medication. Current smoker. States she only smokes socially now. Same partner. Recently married on 3/20.  First day of menstrual cycle today. Cycle usually lasts 4-5 days. Walks 1-2 miles per day. Takes multivitamins. History of Gardisil vaccine. 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  Past medical history, past surgical history, family history and social history were all reviewed and documented in the EPIC chart. History of allergic rhinitis, mild acne, and ADHD, which are managed by primary care. Working at home currently for [employer]. Anticipating pregnancy, but unsure of when. Parents had skin cancer, but not melanoma.  ROS:  A ROS was performed and pertinent positives and negatives are included.  Exam:  Vitals:   10/01/19 0904  BP: 110/78  Weight: 154 lb (69.9 kg)  Height: 5\' 6"  (1.676 m)   Body mass index is 24.86 kg/m.   General appearance:  Normal Thyroid:  Symmetrical, normal in size, without palpable masses or nodularity. Respiratory  Auscultation:  Clear without wheezing or rhonchi Cardiovascular  Auscultation:  Regular rate, without rubs, murmurs or gallops  Edema/varicosities:  Not grossly evident Abdominal  Soft,nontender, without masses, guarding or rebound.  Liver/spleen:  No organomegaly noted  Hernia:  None appreciated  Skin  Inspection:  Grossly normal, superficial erythemic nonraised rash at sternum   Breasts: Examined lying and sitting.     Right: Without masses, retractions, discharge or axillary adenopathy.     Left: Without masses, retractions, discharge or axillary adenopathy. Gentitourinary   Inguinal/mons:  Normal without inguinal adenopathy  External genitalia:  Several small   BUS/Urethra/Skene's glands:  Normal  Vagina: Menstrual blood  Cervix:  Normal  Uterus:  Normal  position, normal in size, shape and contour.  Midline and mobile  Adnexa/parametria:     Rt: Without masses or tenderness.   Lt: Without masses or tenderness.  Anus and perineum: Normal  Digital rectal exam: N/A  Assessment/Plan:  38 y.o. MWF G0 for annual exam with no complaints.   Regular monthly cycle/condoms ADHD-primary care manages meds and labs 2016 LEEP for CIN-3 normal Paps after Rare smoking  Plan: Encouraged to keep exercise routine and continue intake of multivitamins. SBE's, discussed the need for mammogram screening at age 24. Educated on fertility and how it decrease after the age 80. Discussed the importance of smoking cessation.  Valisone 0.1% ointment twice daily small amount to rash at sternum, follow-up with dermatology if no relief.  Pap    [place], 9:09 AM 10/01/2019

## 2019-10-03 LAB — PAP IG W/ RFLX HPV ASCU

## 2019-10-15 ENCOUNTER — Encounter: Payer: Self-pay | Admitting: Family Medicine

## 2019-10-23 ENCOUNTER — Other Ambulatory Visit: Payer: Self-pay | Admitting: Family Medicine

## 2019-10-23 DIAGNOSIS — F988 Other specified behavioral and emotional disorders with onset usually occurring in childhood and adolescence: Secondary | ICD-10-CM

## 2019-10-24 NOTE — Telephone Encounter (Signed)
Requesting: Adderrall  Contract: 02/09/2018 UDS: 02/09/2018 Last OV: 04/30/2019 Next OV: N/A Last Refill: 09/20/2019, #90--0 RF Database:   Please advise

## 2019-11-23 ENCOUNTER — Encounter: Payer: Self-pay | Admitting: Family Medicine

## 2019-11-23 DIAGNOSIS — F988 Other specified behavioral and emotional disorders with onset usually occurring in childhood and adolescence: Secondary | ICD-10-CM

## 2019-11-23 MED ORDER — AMPHETAMINE-DEXTROAMPHETAMINE 10 MG PO TABS: ORAL_TABLET | ORAL | 0 refills | Status: DC

## 2019-11-23 NOTE — Telephone Encounter (Signed)
Requesting: Adderall Contract: 02/09/2018 UDS: 02/09/2018 Last OV: 04/30/2019 Next OV: N/A Last Refill: 10/24/2019, #90--0 RF Database:   Please advise

## 2019-12-13 DIAGNOSIS — Z20828 Contact with and (suspected) exposure to other viral communicable diseases: Secondary | ICD-10-CM | POA: Diagnosis not present

## 2019-12-28 ENCOUNTER — Encounter: Payer: Self-pay | Admitting: Family Medicine

## 2019-12-28 ENCOUNTER — Other Ambulatory Visit: Payer: Self-pay | Admitting: Family Medicine

## 2019-12-28 DIAGNOSIS — F988 Other specified behavioral and emotional disorders with onset usually occurring in childhood and adolescence: Secondary | ICD-10-CM

## 2019-12-28 MED ORDER — AMPHETAMINE-DEXTROAMPHETAMINE 10 MG PO TABS: ORAL_TABLET | ORAL | 0 refills | Status: DC

## 2020-01-01 DIAGNOSIS — Z20828 Contact with and (suspected) exposure to other viral communicable diseases: Secondary | ICD-10-CM | POA: Diagnosis not present

## 2020-01-01 DIAGNOSIS — Z03818 Encounter for observation for suspected exposure to other biological agents ruled out: Secondary | ICD-10-CM | POA: Diagnosis not present

## 2020-01-28 ENCOUNTER — Other Ambulatory Visit: Payer: Self-pay | Admitting: Family Medicine

## 2020-01-28 DIAGNOSIS — F988 Other specified behavioral and emotional disorders with onset usually occurring in childhood and adolescence: Secondary | ICD-10-CM

## 2020-01-28 NOTE — Telephone Encounter (Signed)
Requesting:Adderall Contract:02/09/2018 UDS:02/09/2018 Last Visit:04/30/19 Next Visit:none scheduled Last Refill:12/28/2019  Please Advise

## 2020-01-30 ENCOUNTER — Encounter: Payer: Self-pay | Admitting: Family Medicine

## 2020-01-30 ENCOUNTER — Other Ambulatory Visit: Payer: Self-pay | Admitting: Family Medicine

## 2020-01-30 DIAGNOSIS — F988 Other specified behavioral and emotional disorders with onset usually occurring in childhood and adolescence: Secondary | ICD-10-CM

## 2020-01-30 MED ORDER — AMPHETAMINE-DEXTROAMPHETAMINE 10 MG PO TABS: ORAL_TABLET | ORAL | 0 refills | Status: DC

## 2020-01-30 NOTE — Telephone Encounter (Signed)
Last Adderal RX:  12/28/19, #90 Last OV: 04/30/19 Next OV: not scheduled, past due UDS: 08/09/18, past due CSC: 02/09/18, past due

## 2020-02-26 ENCOUNTER — Encounter: Payer: Self-pay | Admitting: Family Medicine

## 2020-02-29 ENCOUNTER — Ambulatory Visit: Payer: BC Managed Care – PPO | Admitting: Family Medicine

## 2020-02-29 ENCOUNTER — Other Ambulatory Visit: Payer: Self-pay

## 2020-02-29 ENCOUNTER — Encounter: Payer: Self-pay | Admitting: Family Medicine

## 2020-02-29 VITALS — BP 142/96 | HR 109 | Temp 98.0°F | Resp 18 | Ht 66.0 in | Wt 151.2 lb

## 2020-02-29 DIAGNOSIS — F988 Other specified behavioral and emotional disorders with onset usually occurring in childhood and adolescence: Secondary | ICD-10-CM | POA: Diagnosis not present

## 2020-02-29 DIAGNOSIS — Z79899 Other long term (current) drug therapy: Secondary | ICD-10-CM

## 2020-02-29 MED ORDER — AMPHETAMINE-DEXTROAMPHETAMINE 10 MG PO TABS: ORAL_TABLET | ORAL | 0 refills | Status: DC

## 2020-02-29 NOTE — Progress Notes (Signed)
Patient ID: Destiny Rangel, female    DOB: [birth year]  Age: 39 y.o. MRN: [id number]    Subjective:  Subjective  HPI Destiny Rangel presents for f/u add -- no complaints   Review of Systems  Constitutional: Negative for activity change, appetite change, fatigue and unexpected weight change.  Respiratory: Negative for cough and shortness of breath.   Cardiovascular: Negative for chest pain and palpitations.  Psychiatric/Behavioral: Negative for behavioral problems, decreased concentration and dysphoric mood. The patient is not nervous/anxious and is not hyperactive.     History Past Medical History:  Diagnosis Date  . ADHD (attention deficit hyperactivity disorder)   . Allergy     She has a past surgical history that includes Wisdom tooth extraction and Cervical conization w/bx (N/A, 12/02/2015).   Her family history includes Alcohol abuse in an other family member; Cancer in her father, maternal grandfather, and mother; Depression in an other family member; Parkinsonism in her maternal grandmother.She reports that she has been smoking cigarettes. She has never used smokeless tobacco. She reports current alcohol use. She reports that she does not use drugs.  Current Outpatient Medications on File Prior to Visit  Medication Sig Dispense Refill  . ALPRAZolam (XANAX) 0.25 MG tablet Take 1 tablet (0.25 mg total) by mouth 3 (three) times daily as needed. 30 tablet 0  . betamethasone valerate ointment (VALISONE) 0.1 % Apply 1 application topically 2 (two) times daily. 30 g 0  . fluticasone (FLONASE) 50 MCG/ACT nasal spray Place 2 sprays into both nostrils daily. 16 g 6  . levocetirizine (XYZAL) 5 MG tablet Take 1 tablet (5 mg total) by mouth every evening. 30 tablet 5   No current facility-administered medications on file prior to visit.     Objective:  Objective  Physical Exam Vitals and nursing note reviewed.  Constitutional:      Appearance: She is well-developed.  HENT:     Head:  Normocephalic and atraumatic.  Eyes:     Conjunctiva/sclera: Conjunctivae normal.  Neck:     Thyroid: No thyromegaly.     Vascular: No carotid bruit or JVD.  Cardiovascular:     Rate and Rhythm: Normal rate and regular rhythm.     Heart sounds: Normal heart sounds. No murmur.  Pulmonary:     Effort: Pulmonary effort is normal. No respiratory distress.     Breath sounds: Normal breath sounds. No wheezing or rales.  Chest:     Chest wall: No tenderness.  Musculoskeletal:     Cervical back: Normal range of motion and neck supple.  Neurological:     Mental Status: She is alert and oriented to person, place, and time.  Psychiatric:        Mood and Affect: Mood normal.        Behavior: Behavior normal.        Thought Content: Thought content normal.        Judgment: Judgment normal.    BP (!) 142/96 (BP Location: Left Arm, Patient Position: Sitting, Cuff Size: Normal)   Pulse (!) 109   Temp 98 F (36.7 C) (Temporal)   Resp 18   Ht 5\' 6"  (1.676 m)   Wt 151 lb 3.2 oz (68.6 kg)   SpO2 99%   BMI 24.40 kg/m  Wt Readings from Last 3 Encounters:  02/29/20 151 lb 3.2 oz (68.6 kg)  10/01/19 154 lb (69.9 kg)  10/27/18 161 lb (73 kg)     Lab Results  Component Value Date   WBC  7.9 10/27/2018   HGB 13.9 10/27/2018   HCT 40.8 10/27/2018   PLT 282.0 10/27/2018   GLUCOSE 74 10/27/2018   CHOL 180 10/27/2018   TRIG 57.0 10/27/2018   HDL 78.90 10/27/2018   LDLCALC 90 10/27/2018   ALT 22 10/27/2018   AST 23 10/27/2018   NA 135 10/27/2018   K 4.2 10/27/2018   CL 102 10/27/2018   CREATININE 0.59 10/27/2018   BUN 12 10/27/2018   CO2 26 10/27/2018   TSH 3.15 10/27/2018    No results found.   Assessment & Plan:  Plan  I am having Destiny Rangel maintain her ALPRAZolam, fluticasone, levocetirizine, betamethasone valerate ointment, and amphetamine-dextroamphetamine.  Meds ordered this encounter  Medications  . amphetamine-dextroamphetamine (ADDERALL) 10 MG tablet    Sig: TAKE 1  TABLET BY MOUTH EVERY MORNING AND 2 TABLETS EVERY EVENING    Dispense:  90 tablet    Refill:  0    Problem List Items Addressed This Visit      Unprioritized   Attention deficit disorder (ADD) without hyperactivity    Stable con't meds rto 6 months or sooner prn       Relevant Medications   amphetamine-dextroamphetamine (ADDERALL) 10 MG tablet   Other Relevant Orders   Pain Mgmt, Profile 8 w/Conf, U    Other Visit Diagnoses    High risk medication use    -  Primary   Relevant Orders   Pain Mgmt, Profile 8 w/Conf, U      Follow-up: Return in about 6 months (around 08/31/2020) for f/u add.  Donato Schultz, DO

## 2020-02-29 NOTE — Assessment & Plan Note (Signed)
Stable con't meds rto 6 months or sooner prn 

## 2020-02-29 NOTE — Patient Instructions (Signed)

## 2020-03-03 LAB — PAIN MGMT, PROFILE 8 W/CONF, U
6 Acetylmorphine: NEGATIVE ng/mL
Alcohol Metabolites: POSITIVE ng/mL — AB (ref <500)
Amphetamine: 5702 ng/mL
Amphetamines: POSITIVE ng/mL
Benzodiazepines: NEGATIVE ng/mL
Buprenorphine, Urine: NEGATIVE ng/mL
Cocaine Metabolite: NEGATIVE ng/mL
Creatinine: 114.1 mg/dL
Ethyl Glucuronide (ETG): 65371 ng/mL
Ethyl Sulfate (ETS): 17834 ng/mL
MDMA: NEGATIVE ng/mL
Marijuana Metabolite: NEGATIVE ng/mL
Methamphetamine: NEGATIVE ng/mL
Opiates: NEGATIVE ng/mL
Oxidant: NEGATIVE ug/mL
Oxycodone: NEGATIVE ng/mL
pH: 6.4 (ref 4.5–9.0)

## 2020-03-27 ENCOUNTER — Encounter: Payer: Self-pay | Admitting: Family Medicine

## 2020-03-27 DIAGNOSIS — F988 Other specified behavioral and emotional disorders with onset usually occurring in childhood and adolescence: Secondary | ICD-10-CM

## 2020-03-27 MED ORDER — AMPHETAMINE-DEXTROAMPHETAMINE 10 MG PO TABS: ORAL_TABLET | ORAL | 0 refills | Status: DC

## 2020-03-27 NOTE — Telephone Encounter (Signed)
Requesting:adderall  Contract:yes UDS:low risk  Last OV:02/29/20 Next OV:n/a Last Refill:02/29/20  #90-0rf Database:  Dr. Laury Axon I have set her up for a 3 month supply is that ok?  Please advise

## 2020-04-04 ENCOUNTER — Encounter: Payer: Self-pay | Admitting: Family Medicine

## 2020-05-16 DIAGNOSIS — D2262 Melanocytic nevi of left upper limb, including shoulder: Secondary | ICD-10-CM | POA: Diagnosis not present

## 2020-05-16 DIAGNOSIS — D225 Melanocytic nevi of trunk: Secondary | ICD-10-CM | POA: Diagnosis not present

## 2020-05-16 DIAGNOSIS — D224 Melanocytic nevi of scalp and neck: Secondary | ICD-10-CM | POA: Diagnosis not present

## 2020-05-16 DIAGNOSIS — L858 Other specified epidermal thickening: Secondary | ICD-10-CM | POA: Diagnosis not present

## 2020-07-04 ENCOUNTER — Other Ambulatory Visit: Payer: Self-pay | Admitting: Family Medicine

## 2020-07-04 ENCOUNTER — Encounter: Payer: Self-pay | Admitting: Family Medicine

## 2020-07-04 DIAGNOSIS — F988 Other specified behavioral and emotional disorders with onset usually occurring in childhood and adolescence: Secondary | ICD-10-CM

## 2020-07-04 MED ORDER — AMPHETAMINE-DEXTROAMPHETAMINE 10 MG PO TABS: ORAL_TABLET | ORAL | 0 refills | Status: DC

## 2020-07-04 NOTE — Telephone Encounter (Signed)
Requesting: Adderall Contract: 02/29/2020 UDS: 02/29/2020, low risk Last OV: 02/29/2020 Next OV: N/A Last Refill: 05/2020 Database:   Please advise

## 2020-09-04 ENCOUNTER — Ambulatory Visit: Payer: BC Managed Care – PPO | Admitting: Family Medicine

## 2020-09-05 ENCOUNTER — Ambulatory Visit: Payer: BC Managed Care – PPO | Admitting: Family Medicine

## 2020-09-05 ENCOUNTER — Other Ambulatory Visit: Payer: Self-pay

## 2020-09-05 ENCOUNTER — Encounter: Payer: Self-pay | Admitting: Family Medicine

## 2020-09-05 VITALS — BP 128/82 | HR 94 | Temp 98.5°F | Resp 18 | Ht 66.0 in | Wt 151.4 lb

## 2020-09-05 DIAGNOSIS — F988 Other specified behavioral and emotional disorders with onset usually occurring in childhood and adolescence: Secondary | ICD-10-CM | POA: Diagnosis not present

## 2020-09-05 DIAGNOSIS — J302 Other seasonal allergic rhinitis: Secondary | ICD-10-CM

## 2020-09-05 MED ORDER — AMPHETAMINE-DEXTROAMPHETAMINE 10 MG PO TABS: ORAL_TABLET | ORAL | 0 refills | Status: DC

## 2020-09-05 MED ORDER — FLUTICASONE PROPIONATE 50 MCG/ACT NA SUSP: 2.0000 | Freq: Every day | NASAL | 6 refills | Status: DC

## 2020-09-05 NOTE — Assessment & Plan Note (Signed)
con't adderall

## 2020-09-05 NOTE — Progress Notes (Signed)
Patient ID: Destiny Rangel, female    DOB: 1981/08/01  Age: 39 y.o. MRN: [id number]    Subjective:  Subjective  HPI Destiny Rangel presents for f/u add.    No complaints    Uds, contract utd    Review of Systems  Constitutional: Negative for appetite change, diaphoresis, fatigue and unexpected weight change.  Eyes: Negative for pain, redness and visual disturbance.  Respiratory: Negative for cough, chest tightness, shortness of breath and wheezing.   Cardiovascular: Negative for chest pain, palpitations and leg swelling.  Endocrine: Negative for cold intolerance, heat intolerance, polydipsia, polyphagia and polyuria.  Genitourinary: Negative for difficulty urinating, dysuria and frequency.  Neurological: Negative for dizziness, light-headedness, numbness and headaches.    History Past Medical History:  Diagnosis Date  . ADHD (attention deficit hyperactivity disorder)   . Allergy     She has a past surgical history that includes Wisdom tooth extraction and Cervical conization w/bx (N/A, 12/02/2015).   Her family history includes Alcohol abuse in an other family member; Cancer in her father, maternal grandfather, and mother; Depression in an other family member; Parkinsonism in her maternal grandmother.She reports that she has been smoking cigarettes. She has never used smokeless tobacco. She reports current alcohol use. She reports that she does not use drugs.  Current Outpatient Medications on File Prior to Visit  Medication Sig Dispense Refill  . ALPRAZolam (XANAX) 0.25 MG tablet Take 1 tablet (0.25 mg total) by mouth 3 (three) times daily as needed. 30 tablet 0  . betamethasone valerate ointment (VALISONE) 0.1 % Apply 1 application topically 2 (two) times daily. 30 g 0  . levocetirizine (XYZAL) 5 MG tablet Take 1 tablet (5 mg total) by mouth every evening. 30 tablet 5   No current facility-administered medications on file prior to visit.     Objective:  Objective  Physical  Exam Vitals and nursing note reviewed.  Constitutional:      Appearance: She is well-developed.  HENT:     Head: Normocephalic and atraumatic.  Eyes:     Conjunctiva/sclera: Conjunctivae normal.  Neck:     Thyroid: No thyromegaly.     Vascular: No carotid bruit or JVD.  Cardiovascular:     Rate and Rhythm: Normal rate and regular rhythm.     Heart sounds: Normal heart sounds. No murmur heard.   Pulmonary:     Effort: Pulmonary effort is normal. No respiratory distress.     Breath sounds: Normal breath sounds. No wheezing or rales.  Chest:     Chest wall: No tenderness.  Musculoskeletal:     Cervical back: Normal range of motion and neck supple.  Neurological:     Mental Status: She is alert and oriented to person, place, and time.    BP 128/82 (BP Location: Right Arm, Patient Position: Sitting, Cuff Size: Normal)   Pulse 94   Temp 98.5 F (36.9 C) (Oral)   Resp 18   Ht 5\' 6"  (1.676 m)   Wt 151 lb 6.4 oz (68.7 kg)   SpO2 98%   BMI 24.44 kg/m  Wt Readings from Last 3 Encounters:  09/05/20 151 lb 6.4 oz (68.7 kg)  02/29/20 151 lb 3.2 oz (68.6 kg)  10/01/19 154 lb (69.9 kg)     Lab Results  Component Value Date   WBC 7.9 10/27/2018   HGB 13.9 10/27/2018   HCT 40.8 10/27/2018   PLT 282.0 10/27/2018   GLUCOSE 74 10/27/2018   CHOL 180 10/27/2018   TRIG 57.0  10/27/2018   HDL 78.90 10/27/2018   LDLCALC 90 10/27/2018   ALT 22 10/27/2018   AST 23 10/27/2018   NA 135 10/27/2018   K 4.2 10/27/2018   CL 102 10/27/2018   CREATININE 0.59 10/27/2018   BUN 12 10/27/2018   CO2 26 10/27/2018   TSH 3.15 10/27/2018    No results found.   Assessment & Plan:  Plan  I am having Destiny Rangel maintain her ALPRAZolam, levocetirizine, betamethasone valerate ointment, fluticasone, and amphetamine-dextroamphetamine.  Meds ordered this encounter  Medications  . fluticasone (FLONASE) 50 MCG/ACT nasal spray    Sig: Place 2 sprays into both nostrils daily.    Dispense:  16 g     Refill:  6  . DISCONTD: amphetamine-dextroamphetamine (ADDERALL) 10 MG tablet    Sig: TAKE 1 TABLET BY MOUTH EVERY MORNING AND 2 TABLETS EVERY EVENING    Dispense:  90 tablet    Refill:  0    Sept  2021  . DISCONTD: amphetamine-dextroamphetamine (ADDERALL) 10 MG tablet    Sig: TAKE 1 TABLET BY MOUTH EVERY MORNING AND 2 TABLETS EVERY EVENING    Dispense:  90 tablet    Refill:  0    Sept  2021  . amphetamine-dextroamphetamine (ADDERALL) 10 MG tablet    Sig: TAKE 1 TABLET BY MOUTH EVERY MORNING AND 2 TABLETS EVERY EVENING    Dispense:  90 tablet    Refill:  0    Sept 2021    Problem List Items Addressed This Visit      Unprioritized   Attention deficit disorder (ADD) without hyperactivity   Relevant Medications   amphetamine-dextroamphetamine (ADDERALL) 10 MG tablet    Other Visit Diagnoses    Seasonal allergies       Relevant Medications   fluticasone (FLONASE) 50 MCG/ACT nasal spray      Follow-up: Return in about 6 months (around 03/05/2021), or if symptoms worsen or fail to improve, for annual exam, fasting.  Donato Schultz, DO

## 2020-09-05 NOTE — Patient Instructions (Signed)

## 2020-10-01 ENCOUNTER — Encounter: Payer: Self-pay | Admitting: Nurse Practitioner

## 2020-10-01 ENCOUNTER — Ambulatory Visit (INDEPENDENT_AMBULATORY_CARE_PROVIDER_SITE_OTHER): Payer: BC Managed Care – PPO | Admitting: Nurse Practitioner

## 2020-10-01 ENCOUNTER — Other Ambulatory Visit: Payer: Self-pay

## 2020-10-01 VITALS — BP 140/80 | Ht 66.0 in | Wt 153.0 lb

## 2020-10-01 DIAGNOSIS — D069 Carcinoma in situ of cervix, unspecified: Secondary | ICD-10-CM

## 2020-10-01 DIAGNOSIS — Z01419 Encounter for gynecological examination (general) (routine) without abnormal findings: Secondary | ICD-10-CM

## 2020-10-01 NOTE — Progress Notes (Signed)
   Destiny Rangel 1981/01/16 [id number]   History:  39 y.o. G0 presents for annual exam without GYN complaints. Regular monthly cycle. 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 Smoker. Has not had screening mammogram.   Gynecologic History Patient's last menstrual period was 09/15/2020. Period Cycle (Days): 28 Period Duration (Days): 5 Period Pattern: Regular Menstrual Flow: Moderate Menstrual Control: Maxi pad, Tampon Dysmenorrhea: (!) Mild Dysmenorrhea Symptoms: Cramping Contraception: none Last Pap: 10/01/2019. Results were: normal Last mammogram: Never   Past medical history, past surgical history, family history and social history were all reviewed and documented in the EPIC chart.  ROS:  A ROS was performed and pertinent positives and negatives are included.  Exam:  Vitals:   10/01/20 0913  BP: 140/80  Weight: 153 lb (69.4 kg)  Height: 5\' 6"  (1.676 m)   Body mass index is 24.69 kg/m.  General appearance:  Normal Thyroid:  Symmetrical, normal in size, without palpable masses or nodularity. Respiratory  Auscultation:  Clear without wheezing or rhonchi Cardiovascular  Auscultation:  Regular rate, without rubs, murmurs or gallops  Edema/varicosities:  Not grossly evident Abdominal  Soft,nontender, without masses, guarding or rebound.  Liver/spleen:  No organomegaly noted  Hernia:  None appreciated  Skin  Inspection:  Grossly normal   Breasts: Examined lying and sitting.   Right: Without masses, retractions, discharge or axillary adenopathy.   Left: Without masses, retractions, discharge or axillary adenopathy. Gentitourinary   Inguinal/mons:  Normal without inguinal adenopathy  External genitalia:  Normal  BUS/Urethra/Skene's glands:  Normal  Vagina:  Normal  Cervix:  Changes from repair/LEEP  Uterus:  Anteverted, normal in size, shape and contour.  Midline and mobile  Adnexa/parametria:     Rt: Without masses or tenderness.   Lt: Without  masses or tenderness.  Anus and perineum: Normal   Assessment/Plan:  39 y.o. G0 for annual exam.   Well female exam with routine gynecological exam - Education provided on SBEs, importance of preventative screenings, current guidelines, high calcium diet, regular exercise, and multivitamin daily. Labs with work.   High grade squamous intraepithelial lesion (HGSIL), grade 3 CIN, on biopsy of cervix - 2016. LEEP with negative margins. Subsequent paps normal. Pap today.  Screening for breast cancer - has not had screening mammogram. Discussed current guidelines and importance of preventative screenings. Information provided on [facility].  Follow up in 1 year for annual.       2017 [facility], 9:20 AM 10/01/2020

## 2020-10-01 NOTE — Patient Instructions (Signed)
Schedule mammogram! [facility] [phone] [address]  [place], [place] [address]  Health Maintenance, Female Adopting a healthy lifestyle and getting preventive care are important in promoting health and wellness. Ask your health care provider about:  The right schedule for you to have regular tests and exams.  Things you can do on your own to prevent diseases and keep yourself healthy. What should I know about diet, weight, and exercise? Eat a healthy diet   Eat a diet that includes plenty of vegetables, fruits, low-fat dairy products, and lean protein.  Do not eat a lot of foods that are high in solid fats, added sugars, or sodium. Maintain a healthy weight Body mass index (BMI) is used to identify weight problems. It estimates body fat based on height and weight. Your health care provider can help determine your BMI and help you achieve or maintain a healthy weight. Get regular exercise Get regular exercise. This is one of the most important things you can do for your health. Most adults should:  Exercise for at least 150 minutes each week. The exercise should increase your heart rate and make you sweat (moderate-intensity exercise).  Do strengthening exercises at least twice a week. This is in addition to the moderate-intensity exercise.  Spend less time sitting. Even light physical activity can be beneficial. Watch cholesterol and blood lipids Have your blood tested for lipids and cholesterol at 40 years of age, then have this test every 5 years. Have your cholesterol levels checked more often if:  Your lipid or cholesterol levels are high.  You are older than 40 years of age.  You are at high risk for heart disease. What should I know about cancer screening? Depending on your health history and family history, you may need to have cancer screening at various ages. This may include screening for:  Breast cancer.  Cervical  cancer.  Colorectal cancer.  Skin cancer.  Lung cancer. What should I know about heart disease, diabetes, and high blood pressure? Blood pressure and heart disease  High blood pressure causes heart disease and increases the risk of stroke. This is more likely to develop in people who have high blood pressure readings, are of African descent, or are overweight.  Have your blood pressure checked: ? Every 3-5 years if you are 18-39 years of age. ? Every year if you are 40 years old or older. Diabetes Have regular diabetes screenings. This checks your fasting blood sugar level. Have the screening done:  Once every three years after age 40 if you are at a normal weight and have a low risk for diabetes.  More often and at a younger age if you are overweight or have a high risk for diabetes. What should I know about preventing infection? Hepatitis B If you have a higher risk for hepatitis B, you should be screened for this virus. Talk with your health care provider to find out if you are at risk for hepatitis B infection. Hepatitis C Testing is recommended for:  Everyone born from 1945 through 1965.  Anyone with known risk factors for hepatitis C. Sexually transmitted infections (STIs)  Get screened for STIs, including gonorrhea and chlamydia, if: ? You are sexually active and are younger than 40 years of age. ? You are older than 40 years of age and your health care provider tells you that you are at risk for this type of infection. ? Your sexual activity has changed since you   were last screened, and you are at increased risk for chlamydia or gonorrhea. Ask your health care provider if you are at risk.  Ask your health care provider about whether you are at high risk for HIV. Your health care provider may recommend a prescription medicine to help prevent HIV infection. If you choose to take medicine to prevent HIV, you should first get tested for HIV. You should then be tested every 3  months for as long as you are taking the medicine. Pregnancy  If you are about to stop having your period (premenopausal) and you may become pregnant, seek counseling before you get pregnant.  Take 400 to 800 micrograms (mcg) of folic acid every day if you become pregnant.  Ask for birth control (contraception) if you want to prevent pregnancy. Osteoporosis and menopause Osteoporosis is a disease in which the bones lose minerals and strength with aging. This can result in bone fractures. If you are 65 years old or older, or if you are at risk for osteoporosis and fractures, ask your health care provider if you should:  Be screened for bone loss.  Take a calcium or vitamin D supplement to lower your risk of fractures.  Be given hormone replacement therapy (HRT) to treat symptoms of menopause. Follow these instructions at home: Lifestyle  Do not use any products that contain nicotine or tobacco, such as cigarettes, e-cigarettes, and chewing tobacco. If you need help quitting, ask your health care provider.  Do not use street drugs.  Do not share needles.  Ask your health care provider for help if you need support or information about quitting drugs. Alcohol use  Do not drink alcohol if: ? Your health care provider tells you not to drink. ? You are pregnant, may be pregnant, or are planning to become pregnant.  If you drink alcohol: ? Limit how much you use to 0-1 drink a day. ? Limit intake if you are breastfeeding.  Be aware of how much alcohol is in your drink. In the [place] one drink equals one 12 oz bottle of beer (355 mL), one 5 oz glass of wine (148 mL), or one 1 oz glass of hard liquor (44 mL). General instructions  Schedule regular health, dental, and eye exams.  Stay current with your vaccines.  Tell your health care provider if: ? You often feel depressed. ? You have ever been abused or do not feel safe at home. Summary  Adopting a healthy lifestyle and getting  preventive care are important in promoting health and wellness.  Follow your health care provider's instructions about healthy diet, exercising, and getting tested or screened for diseases.  Follow your health care provider's instructions on monitoring your cholesterol and blood pressure. This information is not intended to replace advice given to you by your health care provider. Make sure you discuss any questions you have with your health care provider. Document Revised: 11/29/2018 Document Reviewed: 11/29/2018 Elsevier Patient Education  2020 [employer]  

## 2020-10-01 NOTE — Addendum Note (Signed)
Addended by: Tito Dine on: 10/01/2020 10:07 AM   Modules accepted: Orders

## 2020-10-03 LAB — PAP IG W/ RFLX HPV ASCU

## 2020-10-15 ENCOUNTER — Encounter: Payer: Self-pay | Admitting: Family Medicine

## 2020-10-15 DIAGNOSIS — F988 Other specified behavioral and emotional disorders with onset usually occurring in childhood and adolescence: Secondary | ICD-10-CM

## 2020-10-15 MED ORDER — AMPHETAMINE-DEXTROAMPHETAMINE 10 MG PO TABS: ORAL_TABLET | ORAL | 0 refills | Status: DC

## 2020-10-15 NOTE — Telephone Encounter (Signed)
Requesting: Adderall 10mg  Contract: 02/29/2020 UDS: 02/29/2020 Low risk Last Visit: 09/05/2020 Next Visit: None scheduled Last Refill: 09/05/2020 #90 and 0RF  Please Advise

## 2020-11-13 ENCOUNTER — Other Ambulatory Visit: Payer: Self-pay | Admitting: Family Medicine

## 2020-11-13 DIAGNOSIS — R251 Tremor, unspecified: Secondary | ICD-10-CM

## 2020-11-15 NOTE — Telephone Encounter (Signed)
Requesting: xanax Contract: 03/21/20 UDS:n/a Last Visit: 09/05/20 Next Visit: n/a Last Refill:07/18/18  Please Advise

## 2020-11-17 ENCOUNTER — Encounter: Payer: Self-pay | Admitting: Family Medicine

## 2020-11-17 ENCOUNTER — Other Ambulatory Visit: Payer: Self-pay | Admitting: Family Medicine

## 2020-11-17 DIAGNOSIS — F988 Other specified behavioral and emotional disorders with onset usually occurring in childhood and adolescence: Secondary | ICD-10-CM

## 2020-11-17 DIAGNOSIS — R251 Tremor, unspecified: Secondary | ICD-10-CM

## 2020-11-17 MED ORDER — AMPHETAMINE-DEXTROAMPHETAMINE 10 MG PO TABS: ORAL_TABLET | ORAL | 0 refills | Status: DC

## 2020-11-17 MED ORDER — ALPRAZOLAM 0.25 MG PO TABS: 0.2500 mg | ORAL_TABLET | Freq: Three times a day (TID) | ORAL | 0 refills | Status: DC | PRN

## 2020-11-17 NOTE — Telephone Encounter (Signed)
Requesting: Adderall  Contract: 02/29/20 UDS: 02/29/2020, low risk Last OV: 09/05/20 Next OV: N/A Last Refill: 09/30/2020, #90--0 RF Database:   Please advise

## 2020-12-16 ENCOUNTER — Encounter: Payer: Self-pay | Admitting: Family Medicine

## 2020-12-16 DIAGNOSIS — F988 Other specified behavioral and emotional disorders with onset usually occurring in childhood and adolescence: Secondary | ICD-10-CM

## 2020-12-16 MED ORDER — AMPHETAMINE-DEXTROAMPHETAMINE 10 MG PO TABS: ORAL_TABLET | ORAL | 0 refills | Status: DC

## 2020-12-16 NOTE — Telephone Encounter (Signed)
Requesting: Adderall 10mg  Contract: 02/29/2020 UDS: 02/29/2020  Last Visit: 09/05/2020 Next Visit: None Last Refill: 11/17/2020 #90 and 0RF Pt sig: 1 tab every morning and 2 tab every evening  [place] Pt  Please Advise

## 2020-12-20 HISTORY — PX: DILATION AND CURETTAGE OF UTERUS: SHX78

## 2021-01-21 ENCOUNTER — Encounter: Payer: Self-pay | Admitting: Family Medicine

## 2021-01-21 DIAGNOSIS — F988 Other specified behavioral and emotional disorders with onset usually occurring in childhood and adolescence: Secondary | ICD-10-CM

## 2021-01-21 MED ORDER — AMPHETAMINE-DEXTROAMPHETAMINE 10 MG PO TABS: ORAL_TABLET | ORAL | 0 refills | Status: DC

## 2021-01-21 NOTE — Telephone Encounter (Signed)
Requesting: Adderall  Contract: 02/29/2020 UDS: 02/29/2020 Last OV: 09/05/2020 Next OV: N/A Last Refill: 12/16/2020, #90--0 RF Database:   Please advise

## 2021-01-22 ENCOUNTER — Other Ambulatory Visit: Payer: Self-pay | Admitting: *Deleted

## 2021-01-22 DIAGNOSIS — J302 Other seasonal allergic rhinitis: Secondary | ICD-10-CM

## 2021-01-22 MED ORDER — FLUTICASONE PROPIONATE 50 MCG/ACT NA SUSP: 2.0000 | Freq: Every day | NASAL | 1 refills | Status: DC

## 2021-02-20 ENCOUNTER — Other Ambulatory Visit: Payer: Self-pay | Admitting: Family Medicine

## 2021-02-20 ENCOUNTER — Encounter: Payer: Self-pay | Admitting: Family Medicine

## 2021-02-20 DIAGNOSIS — R251 Tremor, unspecified: Secondary | ICD-10-CM

## 2021-02-20 DIAGNOSIS — F988 Other specified behavioral and emotional disorders with onset usually occurring in childhood and adolescence: Secondary | ICD-10-CM

## 2021-02-20 MED ORDER — AMPHETAMINE-DEXTROAMPHETAMINE 10 MG PO TABS
ORAL_TABLET | ORAL | 0 refills | Status: DC
Start: 2021-02-20 — End: 2021-03-24

## 2021-02-20 NOTE — Telephone Encounter (Signed)
Requesting: alprazolam 0.25mg  Contract: 02/29/20  UDS: 02/29/20 Last Visit:  09/05/2020 Next Visit: None Last Refill: 11/17/2020 #30 and 0RF  Please Advise

## 2021-02-20 NOTE — Telephone Encounter (Signed)
Requesting: Adderall  Contract: 02/29/20         UDS: 02/29/20 Last Visit:  09/05/2020 Next Visit: None Last Refill: 01/21/2021 #90 and 0RF Pt sig: 1 tab qam and 2 tab qpm  Please dvise.

## 2021-03-10 NOTE — Telephone Encounter (Signed)
Last fill for this was 11/17/20 and no rx was sent in (the denial says request already sent).

## 2021-03-11 ENCOUNTER — Other Ambulatory Visit: Payer: Self-pay | Admitting: Family Medicine

## 2021-03-11 DIAGNOSIS — R251 Tremor, unspecified: Secondary | ICD-10-CM

## 2021-03-11 MED ORDER — ALPRAZOLAM 0.25 MG PO TABS: 0.2500 mg | ORAL_TABLET | Freq: Three times a day (TID) | ORAL | 0 refills | Status: DC | PRN

## 2021-03-24 ENCOUNTER — Encounter: Payer: Self-pay | Admitting: Family Medicine

## 2021-03-24 DIAGNOSIS — F988 Other specified behavioral and emotional disorders with onset usually occurring in childhood and adolescence: Secondary | ICD-10-CM

## 2021-03-24 MED ORDER — AMPHETAMINE-DEXTROAMPHETAMINE 10 MG PO TABS: ORAL_TABLET | ORAL | 0 refills | Status: DC

## 2021-03-24 NOTE — Telephone Encounter (Signed)
Requesting: Adderall 10mg  Contract: 02/29/20 UDS: 02/29/20 Last Visit: 09/05/20 Next Visit: None, Pt informed she is due for visit Last Refill: 02/20/2021 #90 and 0RF  Please Advise

## 2021-03-30 ENCOUNTER — Encounter: Payer: Self-pay | Admitting: Family Medicine

## 2021-03-30 ENCOUNTER — Other Ambulatory Visit: Payer: Self-pay

## 2021-03-30 ENCOUNTER — Ambulatory Visit: Payer: BC Managed Care – PPO | Admitting: Family Medicine

## 2021-03-30 VITALS — BP 120/84 | HR 84 | Temp 98.3°F | Resp 18 | Ht 66.0 in | Wt 157.8 lb

## 2021-03-30 DIAGNOSIS — R61 Generalized hyperhidrosis: Secondary | ICD-10-CM | POA: Diagnosis not present

## 2021-03-30 DIAGNOSIS — F988 Other specified behavioral and emotional disorders with onset usually occurring in childhood and adolescence: Secondary | ICD-10-CM | POA: Diagnosis not present

## 2021-03-30 DIAGNOSIS — Z79899 Other long term (current) drug therapy: Secondary | ICD-10-CM

## 2021-03-30 DIAGNOSIS — L719 Rosacea, unspecified: Secondary | ICD-10-CM | POA: Diagnosis not present

## 2021-03-30 DIAGNOSIS — L700 Acne vulgaris: Secondary | ICD-10-CM

## 2021-03-30 DIAGNOSIS — R251 Tremor, unspecified: Secondary | ICD-10-CM | POA: Diagnosis not present

## 2021-03-30 MED ORDER — AZELAIC ACID 15 % EX GEL: CUTANEOUS | 0 refills | Status: AC

## 2021-03-30 NOTE — Assessment & Plan Note (Signed)
F/u derm  meds per orders

## 2021-03-30 NOTE — Assessment & Plan Note (Signed)
finacea per orders F/u derm

## 2021-03-30 NOTE — Progress Notes (Signed)
Patient ID: Destiny Rangel, female    DOB: 1981/07/14  Age: 40 y.o. MRN: [id number]    Subjective:  Subjective  HPI [name] presents for an office visit today. She complains of bilateral cheek skin growth (R>L). She describes it as a pimple, that started 1 month ago. She notes that it may be the result from rosacea. She reports that she has been applying skin products onto the growth, however it has sightly decreased in size. She also complains of increase in sweat in right arm pit and bilateral palms. She reports that when the weather is cold, she experinces sweaty palms and arm pits. She notes that it maybe the result from anxiety. She states that she is having difficult in the housing market and had lost 40-60 k dollars.She also complains of acne. She denies any chest pain, SOB, fever, abdominal pain, cough, chills, sore throat, dysuria, urinary incontinence, back pain, HA, or N/V/D at this time.   Review of Systems  Constitutional: Negative for chills, fatigue and fever.  HENT: Negative for ear pain, sinus pressure, sinus pain and sore throat.   Eyes: Negative for pain.  Respiratory: Negative for cough and shortness of breath.   Cardiovascular: Negative for chest pain, palpitations and leg swelling.  Gastrointestinal: Negative for abdominal pain, blood in stool, constipation, diarrhea, nausea and vomiting.  Endocrine:       (+) increase sweating in right armpit and bilateral palms.  Genitourinary: Negative for dysuria, frequency, hematuria and urgency.  Musculoskeletal: Negative for back pain.  Skin:       (+) bilateral cheek skin growth (R>L)  (+) acne  Neurological: Negative for dizziness and headaches.    History Past Medical History:  Diagnosis Date  . ADHD (attention deficit hyperactivity disorder)   . Allergy     She has a past surgical history that includes Wisdom tooth extraction and Cervical conization w/bx (N/A, 12/02/2015).   Her family history includes Alcohol abuse  in an other family member; Cancer in her father, maternal grandfather, and mother; Depression in an other family member; Parkinsonism in her maternal grandmother.She reports that she has been smoking cigarettes. She has never used smokeless tobacco. She reports current alcohol use. She reports that she does not use drugs.  Current Outpatient Medications on File Prior to Visit  Medication Sig Dispense Refill  . ALPRAZolam (XANAX) 0.25 MG tablet Take 1 tablet (0.25 mg total) by mouth 3 (three) times daily as needed. 30 tablet 0  . amphetamine-dextroamphetamine (ADDERALL) 10 MG tablet TAKE 1 TABLET BY MOUTH EVERY MORNING AND 2 TABLETS EVERY EVENING 90 tablet 0  . betamethasone valerate ointment (VALISONE) 0.1 % Apply 1 application topically 2 (two) times daily. 30 g 0  . fluticasone (FLONASE) 50 MCG/ACT nasal spray Place 2 sprays into both nostrils daily. 48 g 1   No current facility-administered medications on file prior to visit.     Objective:  Objective  Physical Exam Vitals and nursing note reviewed.  Constitutional:      General: She is not in acute distress.    Appearance: Normal appearance. She is well-developed. She is not ill-appearing.  HENT:     Head: Normocephalic and atraumatic.     Right Ear: External ear normal.     Left Ear: External ear normal.     Nose: Nose normal.  Eyes:     Extraocular Movements: Extraocular movements intact.     Pupils: Pupils are equal, round, and reactive to light.  Cardiovascular:  Rate and Rhythm: Normal rate and regular rhythm.     Pulses: Normal pulses.     Heart sounds: Normal heart sounds. No murmur heard. No friction rub. No gallop.   Pulmonary:     Effort: Pulmonary effort is normal. No respiratory distress.     Breath sounds: Normal breath sounds. No stridor. No wheezing, rhonchi or rales.  Abdominal:     General: Bowel sounds are normal. There is no distension.     Palpations: Abdomen is soft.     Tenderness: There is no  abdominal tenderness. There is no guarding.     Hernia: No hernia is present.  Musculoskeletal:        General: Normal range of motion.     Cervical back: Normal range of motion and neck supple.  Skin:    General: Skin is warm and dry.     Findings: Rash present. Rash is papular.       Neurological:     Mental Status: She is alert and oriented to person, place, and time.  Psychiatric:        Behavior: Behavior normal.        Thought Content: Thought content normal.    BP 120/84 (BP Location: Right Arm, Patient Position: Sitting, Cuff Size: Normal)   Pulse 84   Temp 98.3 F (36.8 C) (Oral)   Resp 18   Ht 5\' 6"  (1.676 m)   Wt 157 lb 12.8 oz (71.6 kg)   SpO2 100%   BMI 25.47 kg/m  Wt Readings from Last 3 Encounters:  03/30/21 157 lb 12.8 oz (71.6 kg)  10/01/20 153 lb (69.4 kg)  09/05/20 151 lb 6.4 oz (68.7 kg)     Lab Results  Component Value Date   WBC 7.9 10/27/2018   HGB 13.9 10/27/2018   HCT 40.8 10/27/2018   PLT 282.0 10/27/2018   GLUCOSE 74 10/27/2018   CHOL 180 10/27/2018   TRIG 57.0 10/27/2018   HDL 78.90 10/27/2018   LDLCALC 90 10/27/2018   ALT 22 10/27/2018   AST 23 10/27/2018   NA 135 10/27/2018   K 4.2 10/27/2018   CL 102 10/27/2018   CREATININE 0.59 10/27/2018   BUN 12 10/27/2018   CO2 26 10/27/2018   TSH 3.15 10/27/2018    No results found.   Assessment & Plan:  Plan    Meds ordered this encounter  Medications  . Azelaic Acid 15 % cream    Sig: After skin is thoroughly washed and patted dry, gently but thoroughly massage a thin film of azelaic acid cream into the affected area twice daily, in the morning and evening.    Dispense:  30 g    Refill:  0    Problem List Items Addressed This Visit      Unprioritized   Acne vulgaris    finacea per orders F/u derm       Attention deficit disorder (ADD) without hyperactivity   Relevant Orders   DRUG MONITORING, PANEL 8 WITH CONFIRMATION, URINE   High risk medication use   Relevant  Orders   DRUG MONITORING, PANEL 8 WITH CONFIRMATION, URINE   Hyperhidrosis    Ok to try clinical strength Deoderant F/u derm       Rosacea - Primary    F/u derm  meds per orders       Relevant Medications   Azelaic Acid 15 % cream   Tremor   Relevant Orders   DRUG MONITORING, PANEL 8 WITH CONFIRMATION,  URINE      Follow-up: Return in about 6 months (around 09/29/2021), or if symptoms worsen or fail to improve, for add.   I,Gordon Zheng,acting as a Neurosurgeon for [employer], DO.,have documented all relevant documentation on the behalf of Donato Schultz, DO,as directed by  Donato Schultz, DO while in the presence of Donato Schultz, DO.  I, Donato Schultz, DO, have reviewed all documentation for this visit. The documentation on 03/30/21 for the exam, diagnosis, procedures, and orders are all accurate and complete.

## 2021-03-30 NOTE — Patient Instructions (Signed)
Hyperhidrosis Hyperhidrosis is a condition in which the body sweats a lot more than normal (excessively). Sweating is a necessary function for a human body. It is normal to sweat when you are hot, physically active, or anxious. However, hyperhidrosis is sweating to an excessive degree. Although the condition is not a serious one, it can make you feel embarrassed. There are two kinds of hyperhidrosis:  Primary hyperhidrosis. The sweating usually localizes in one part of your body, such as your underarms, or in a few areas, such as your feet, face, underarms, and hands. This is the more common kind of hyperhidrosis.  Secondary hyperhidrosis. This type usually affects your entire body. What are the causes? The cause of this condition depends on the kind of hyperhidrosis that you have.  Primary hyperhidrosis may be caused by sweat glands that are more active than normal.  Secondary hyperhidrosis may be caused by an underlying condition or by taking certain medicines, such as antidepressants or diabetes medicines. Possible conditions that may cause secondary hyperhidrosis include: ? Diabetes. ? Gout. ? Anxiety. ? Obesity. ? Menopause. ? Overactive thyroid (hyperthyroidism). ? Tumors. ? Frostbite. ? Certain types of cancers. ? Alcoholism. ? Injury to your nervous system. ? Stroke. ? Parkinson's disease. What increases the risk? You are more likely to develop primary hyperhidrosis if you have a family history of the condition. What are the signs or symptoms? Symptoms of this condition include:  Feeling like you are sweating constantly, even while you are not being active.  Having skin that peels or gets paler or softer in the areas where you sweat the most.  Being able to see sweat on your skin. Other symptoms depend on the kind of hyperhidrosis that you have.  Symptoms of primary hyperhidrosis may include: ? Sweating in the same location on both sides of your body. ? Sweating only  during the day and not while you are sleeping. ? Sweating in specific areas, such as your underarms, palms, feet, and face.  Symptoms of secondary hyperhidrosis may include: ? Sweating all over your body. ? Sweating even while you sleep. How is this diagnosed? This condition may be diagnosed by:  Medical history.  Physical exam. You may also have other tests, including:  Tests to measure the amount of sweat you produce and to show the areas where you sweat the most. These tests may involve: ? Using color-changing chemicals to show patterns of sweating on the skin. ? Weighing paper that has been applied to the skin. This will show the amount of sweat that your body produces. ? Measuring the amount of water that evaporates from the skin. ? Using infrared technology to show patterns of sweating on the skin.  Tests to check for other conditions that may be causing excess sweating. This may include blood, urine, or imaging tests. How is this treated? Treatment for this condition depends on the kind of hyperhidrosis that you have and the areas of your body that are affected. Your health care provider will also treat any underlying conditions. Treatment may include:  Medicines, such as: ? Antiperspirants. These are medicines that stop sweat. ? Injectable medicines. These may include small injections of botulinum toxin. ? Oral medicines. These are taken by mouth to treat underlying conditions and other symptoms.  A procedure to: ? Temporarily turn off the sweat glands in your hands and feet (iontophoresis). ? Remove your sweat glands. ? Cut or destroy the nerves so that they do not send a signal to the sweat   glands (sympathectomy). Follow these instructions at home: Lifestyle  Limit or avoid foods or beverages that may increase your risk of sweating, such as: ? Spicy food. ? Caffeine. ? Alcohol. ? Foods that contain monosodium glutamate (MSG).  If your feet sweat: ? Wear sandals  when possible. ? Do not wear cotton socks. Wear socks that remove or wick moisture from your feet. ? Wear leather shoes. ? Avoid wearing the same pair of shoes for two days in a row.  Try placing sweat pads under your clothes to prevent underarm sweat from showing.  Keep a journal of your sweat symptoms and when they occur. This may help you identify things that trigger your sweating.   General instructions  Take over-the-counter and prescription medicines only as told by your health care provider.  Use antiperspirants as told by your health care provider.  Consider joining a hyperhidrosis support group.  Keep all follow-up visits as told by your health care provider. This is important. Contact a health care provider if:  You have new symptoms.  Your symptoms get worse. Summary  Hyperhidrosis is a condition in which the body sweats a lot more than normal (excessively).  With primary hyperhidrosis, the sweating usually localizes in one part of your body, such as your underarms, or in a few areas, such as your feet, face, underarms, and hands. It is caused by overactive sweat glands in the affected area.  With secondary hyperhidrosis, the sweating affects your entire body. This is caused by an underlying condition.  Treatment for this condition depends on the kind of hyperhidrosis that you have and the parts of your body that are affected. This information is not intended to replace advice given to you by your health care provider. Make sure you discuss any questions you have with your health care provider. Document Revised: 10/01/2020 Document Reviewed: 10/01/2020 Elsevier Patient Education  2021 Elsevier Inc.  

## 2021-03-30 NOTE — Assessment & Plan Note (Signed)
Ok to try clinical strength Deoderant F/u derm

## 2021-04-02 LAB — DRUG MONITORING, PANEL 8 WITH CONFIRMATION, URINE
6 Acetylmorphine: NEGATIVE ng/mL (ref <10)
Alcohol Metabolites: POSITIVE ng/mL — AB
Alphahydroxyalprazolam: 51 ng/mL — ABNORMAL HIGH (ref <25)
Alphahydroxymidazolam: NEGATIVE ng/mL (ref <50)
Alphahydroxytriazolam: NEGATIVE ng/mL (ref <50)
Aminoclonazepam: NEGATIVE ng/mL (ref <25)
Amphetamine: 2097 ng/mL — ABNORMAL HIGH (ref <250)
Amphetamines: POSITIVE ng/mL — AB (ref <500)
Benzodiazepines: POSITIVE ng/mL — AB (ref <100)
Buprenorphine, Urine: NEGATIVE ng/mL (ref <5)
Cocaine Metabolite: NEGATIVE ng/mL (ref <150)
Creatinine: 156.4 mg/dL
Ethyl Glucuronide (ETG): >100000 ng/mL — ABNORMAL HIGH (ref <500)
Ethyl Sulfate (ETS): 63912 ng/mL — ABNORMAL HIGH (ref <100)
Hydroxyethylflurazepam: NEGATIVE ng/mL (ref <50)
Lorazepam: NEGATIVE ng/mL (ref <50)
MDMA: NEGATIVE ng/mL (ref <500)
Marijuana Metabolite: NEGATIVE ng/mL (ref <20)
Methamphetamine: NEGATIVE ng/mL (ref <250)
Nordiazepam: NEGATIVE ng/mL (ref <50)
Opiates: NEGATIVE ng/mL (ref <100)
Oxazepam: NEGATIVE ng/mL (ref <50)
Oxidant: NEGATIVE ug/mL
Oxycodone: NEGATIVE ng/mL (ref <100)
Temazepam: NEGATIVE ng/mL (ref <50)
pH: 7 (ref 4.5–9.0)

## 2021-04-02 LAB — DM TEMPLATE

## 2021-04-08 DIAGNOSIS — Z1231 Encounter for screening mammogram for malignant neoplasm of breast: Secondary | ICD-10-CM | POA: Diagnosis not present

## 2021-04-08 LAB — HM MAMMOGRAPHY: HM Mammogram: ABNORMAL — AB (ref 0–4)

## 2021-04-13 DIAGNOSIS — N6002 Solitary cyst of left breast: Secondary | ICD-10-CM | POA: Diagnosis not present

## 2021-04-13 DIAGNOSIS — R928 Other abnormal and inconclusive findings on diagnostic imaging of breast: Secondary | ICD-10-CM | POA: Diagnosis not present

## 2021-04-13 DIAGNOSIS — R922 Inconclusive mammogram: Secondary | ICD-10-CM | POA: Diagnosis not present

## 2021-04-13 LAB — HM MAMMOGRAPHY: HM Mammogram: NORMAL (ref 0–4)

## 2021-04-24 ENCOUNTER — Other Ambulatory Visit: Payer: Self-pay | Admitting: Family Medicine

## 2021-04-24 ENCOUNTER — Encounter: Payer: Self-pay | Admitting: Family Medicine

## 2021-04-24 DIAGNOSIS — F988 Other specified behavioral and emotional disorders with onset usually occurring in childhood and adolescence: Secondary | ICD-10-CM

## 2021-04-24 MED ORDER — AMPHETAMINE-DEXTROAMPHETAMINE 10 MG PO TABS: ORAL_TABLET | ORAL | 0 refills | Status: DC

## 2021-04-24 NOTE — Telephone Encounter (Signed)
Refilled----  database reviewed

## 2021-04-24 NOTE — Telephone Encounter (Signed)
Requesting: adderall Contract:04/14/21 UDS:03/30/21 Last Visit:03/30/21 Next Visit:n/a Last Refill:03/24/21  Please Advise

## 2021-05-19 DIAGNOSIS — L814 Other melanin hyperpigmentation: Secondary | ICD-10-CM | POA: Diagnosis not present

## 2021-05-19 DIAGNOSIS — D225 Melanocytic nevi of trunk: Secondary | ICD-10-CM | POA: Diagnosis not present

## 2021-05-19 DIAGNOSIS — D2261 Melanocytic nevi of right upper limb, including shoulder: Secondary | ICD-10-CM | POA: Diagnosis not present

## 2021-05-19 DIAGNOSIS — D2262 Melanocytic nevi of left upper limb, including shoulder: Secondary | ICD-10-CM | POA: Diagnosis not present

## 2021-05-25 ENCOUNTER — Encounter: Payer: Self-pay | Admitting: Family Medicine

## 2021-05-25 DIAGNOSIS — R251 Tremor, unspecified: Secondary | ICD-10-CM

## 2021-05-25 DIAGNOSIS — F988 Other specified behavioral and emotional disorders with onset usually occurring in childhood and adolescence: Secondary | ICD-10-CM

## 2021-05-25 MED ORDER — AMPHETAMINE-DEXTROAMPHETAMINE 10 MG PO TABS: ORAL_TABLET | ORAL | 0 refills | Status: DC

## 2021-05-25 MED ORDER — ALPRAZOLAM 0.25 MG PO TABS: 0.2500 mg | ORAL_TABLET | Freq: Three times a day (TID) | ORAL | 0 refills | Status: DC | PRN

## 2021-05-25 NOTE — Telephone Encounter (Signed)
Requesting: Adderall & Xanax  Contract: 03/30/2021 UDS: 03/30/2021 Last OV: 03/30/2021 Next OV: N/A Last Refill: 04/24/21, #90 & 03/11/21, #30 Database:   Please advise

## 2021-06-24 ENCOUNTER — Other Ambulatory Visit: Payer: Self-pay | Admitting: Family Medicine

## 2021-06-24 DIAGNOSIS — F988 Other specified behavioral and emotional disorders with onset usually occurring in childhood and adolescence: Secondary | ICD-10-CM

## 2021-06-24 MED ORDER — AMPHETAMINE-DEXTROAMPHETAMINE 10 MG PO TABS: ORAL_TABLET | ORAL | 0 refills | Status: DC

## 2021-06-24 NOTE — Telephone Encounter (Signed)
Requesting: adderall Contract: 03/30/21 UDS: 03/30/21 Last Visit: 03/30/21 Next Visit: none Last Refill: 05/25/21  Please Advise

## 2021-07-29 ENCOUNTER — Other Ambulatory Visit: Payer: Self-pay | Admitting: Family Medicine

## 2021-07-29 DIAGNOSIS — F988 Other specified behavioral and emotional disorders with onset usually occurring in childhood and adolescence: Secondary | ICD-10-CM

## 2021-07-29 MED ORDER — AMPHETAMINE-DEXTROAMPHETAMINE 10 MG PO TABS: ORAL_TABLET | ORAL | 0 refills | Status: DC

## 2021-07-29 NOTE — Telephone Encounter (Signed)
Patient is requesting a refill of the following medications: Requested Prescriptions   Pending Prescriptions Disp Refills   amphetamine-dextroamphetamine (ADDERALL) 10 MG tablet 90 tablet 0    Sig: TAKE 1 TABLET BY MOUTH EVERY MORNING AND 2 TABLETS EVERY EVENING    Date of patient request: 07/29/21 Last office visit: 03/30/21 Date of last refill: 06/24/21 Last refill amount: 90 Follow up time period per chart: none

## 2021-08-28 ENCOUNTER — Other Ambulatory Visit: Payer: Self-pay | Admitting: Family Medicine

## 2021-08-28 DIAGNOSIS — R251 Tremor, unspecified: Secondary | ICD-10-CM

## 2021-08-28 DIAGNOSIS — F988 Other specified behavioral and emotional disorders with onset usually occurring in childhood and adolescence: Secondary | ICD-10-CM

## 2021-08-28 MED ORDER — AMPHETAMINE-DEXTROAMPHETAMINE 10 MG PO TABS: ORAL_TABLET | ORAL | 0 refills | Status: DC

## 2021-08-28 MED ORDER — ALPRAZOLAM 0.25 MG PO TABS: 0.2500 mg | ORAL_TABLET | Freq: Three times a day (TID) | ORAL | 0 refills | Status: DC | PRN

## 2021-08-28 NOTE — Telephone Encounter (Signed)
Requesting: adderall and alprazolam Contract: 03/30/21 UDS: 03/30/21 Last Visit: 03/30/21 Next Visit: none Last Refill: adderall 07/29/21 and alprazolam 05/25/21  Please Advise

## 2021-09-24 ENCOUNTER — Other Ambulatory Visit: Payer: Self-pay | Admitting: Family Medicine

## 2021-09-24 DIAGNOSIS — J302 Other seasonal allergic rhinitis: Secondary | ICD-10-CM

## 2021-09-25 ENCOUNTER — Other Ambulatory Visit: Payer: Self-pay | Admitting: Family Medicine

## 2021-09-25 DIAGNOSIS — R251 Tremor, unspecified: Secondary | ICD-10-CM

## 2021-09-25 DIAGNOSIS — F988 Other specified behavioral and emotional disorders with onset usually occurring in childhood and adolescence: Secondary | ICD-10-CM

## 2021-09-25 MED ORDER — AMPHETAMINE-DEXTROAMPHETAMINE 10 MG PO TABS: ORAL_TABLET | ORAL | 0 refills | Status: DC

## 2021-09-25 MED ORDER — ALPRAZOLAM 0.25 MG PO TABS: 0.2500 mg | ORAL_TABLET | Freq: Three times a day (TID) | ORAL | 0 refills | Status: DC | PRN

## 2021-09-25 NOTE — Telephone Encounter (Signed)
Requesting: xanax and adderall Contract: 03/30/21 UDS: 03/30/21 Last Visit: 03/30/21 Next Visit: none Last Refill: 08/28/21  Please Advise

## 2021-10-05 ENCOUNTER — Ambulatory Visit (INDEPENDENT_AMBULATORY_CARE_PROVIDER_SITE_OTHER): Payer: BC Managed Care – PPO | Admitting: Nurse Practitioner

## 2021-10-05 ENCOUNTER — Encounter: Payer: Self-pay | Admitting: Nurse Practitioner

## 2021-10-05 ENCOUNTER — Other Ambulatory Visit: Payer: Self-pay

## 2021-10-05 VITALS — BP 116/74 | Ht 65.5 in | Wt 163.0 lb

## 2021-10-05 DIAGNOSIS — Z01419 Encounter for gynecological examination (general) (routine) without abnormal findings: Secondary | ICD-10-CM

## 2021-10-05 NOTE — Progress Notes (Signed)
   Destiny Rangel Jun 27, 1981 [id number]   History:  40 y.o. G0 presents for annual exam without GYN complaints. Monthly cycles. 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 Smoker, has cut back significantly.   Gynecologic History Patient's last menstrual period was 09/17/2021. Period Cycle (Days): 28 Period Duration (Days): 5 Period Pattern: Regular Menstrual Flow: Moderate Dysmenorrhea: (!) Moderate Dysmenorrhea Symptoms: Cramping Contraception: none Sexually active: Yes  Health Maintenance Last Pap: 10/01/2020. Results were: Normal, 3-year repeat Last mammogram: 04/13/2021. Results were: Normal after ultrasound Last colonoscopy: Not indicated Last Dexa: Not indicated  Past medical history, past surgical history, family history and social history were all reviewed and documented in the EPIC chart. Married. Works for [employer].   ROS:  A ROS was performed and pertinent positives and negatives are included.  Exam:  Vitals:   10/05/21 0906  BP: 116/74  Weight: 163 lb (73.9 kg)  Height: 5' 5.5" (1.664 m)    Body mass index is 26.71 kg/m.  General appearance:  Normal Thyroid:  Symmetrical, normal in size, without palpable masses or nodularity. Respiratory  Auscultation:  Clear without wheezing or rhonchi Cardiovascular  Auscultation:  Regular rate, without rubs, murmurs or gallops  Edema/varicosities:  Not grossly evident Abdominal  Soft,nontender, without masses, guarding or rebound.  Liver/spleen:  No organomegaly noted  Hernia:  None appreciated  Skin  Inspection:  Grossly normal   Breasts: Examined lying and sitting.   Right: Without masses, retractions, discharge or axillary adenopathy.   Left: Without masses, retractions, discharge or axillary adenopathy. Genitourinary   Inguinal/mons:  Normal without inguinal adenopathy  External genitalia:  Normal appearing vulva with no masses, tenderness, or lesions  BUS/Urethra/Skene's glands:   Normal  Vagina:  Normal appearing with normal color and discharge, no lesions  Cervix:  Normal appearing without discharge or lesions  Uterus:  Normal in size, shape and contour.  Midline and mobile, nontender  Adnexa/parametria:     Rt: Normal in size, without masses or tenderness.   Lt: Normal in size, without masses or tenderness.  Anus and perineum: Normal  Digital rectal exam: Normal sphincter tone without palpated masses or tenderness  Patient informed chaperone available to be present for breast and pelvic exam. Patient has requested no chaperone to be present. Patient has been advised what will be completed during breast and pelvic exam.    Assessment/Plan:  40 y.o. G0 for annual exam.   Well female exam with routine gynecological exam - Education provided on SBEs, importance of preventative screenings, current guidelines, high calcium diet, regular exercise, and multivitamin daily. Labs with PCP.   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 Will repeat at 3-year interval per guidelines.  Screening for breast cancer - Normal mammogram history.  Continue annual screenings.  Normal breast exam today.  Screening for colon cancer - Average risk. Will start screenings at age 39.   Follow up in 1 year for annual.       Olivia Mackie [facility], 9:25 AM 10/05/2021

## 2021-10-23 ENCOUNTER — Other Ambulatory Visit: Payer: Self-pay | Admitting: Family Medicine

## 2021-10-23 DIAGNOSIS — F988 Other specified behavioral and emotional disorders with onset usually occurring in childhood and adolescence: Secondary | ICD-10-CM

## 2021-10-23 MED ORDER — AMPHETAMINE-DEXTROAMPHETAMINE 10 MG PO TABS: ORAL_TABLET | ORAL | 0 refills | Status: DC

## 2021-12-01 ENCOUNTER — Encounter: Payer: Self-pay | Admitting: Family Medicine

## 2021-12-01 ENCOUNTER — Ambulatory Visit: Payer: Self-pay

## 2021-12-01 ENCOUNTER — Other Ambulatory Visit: Payer: Self-pay

## 2021-12-01 ENCOUNTER — Ambulatory Visit (HOSPITAL_BASED_OUTPATIENT_CLINIC_OR_DEPARTMENT_OTHER)
Admission: RE | Admit: 2021-12-01 | Discharge: 2021-12-01 | Disposition: A | Discharge status: Home / Self Care | Payer: BC Managed Care – PPO | Source: Ambulatory Visit | Attending: Family Medicine | Admitting: Family Medicine

## 2021-12-01 ENCOUNTER — Ambulatory Visit: Payer: BC Managed Care – PPO | Admitting: Family Medicine

## 2021-12-01 VITALS — BP 180/100 | Ht 65.5 in | Wt 163.0 lb

## 2021-12-01 DIAGNOSIS — M25461 Effusion, right knee: Secondary | ICD-10-CM | POA: Diagnosis not present

## 2021-12-01 DIAGNOSIS — M25561 Pain in right knee: Secondary | ICD-10-CM

## 2021-12-01 MED ORDER — PREDNISONE 5 MG PO TABS: ORAL_TABLET | ORAL | 0 refills | Status: DC

## 2021-12-01 NOTE — Assessment & Plan Note (Signed)
Patella seems to be high riding which may be contributing to her pain.  Effusion was appreciated on exam as well. -Counseled on home exercise therapy and supportive care. -Prednisone. -X-ray. -Could consider injection or physical therapy.

## 2021-12-01 NOTE — Progress Notes (Signed)
°  Destiny Rangel - 40 y.o. female MRN [id number]  Date of birth: Aug 27, 1981  SUBJECTIVE:  Including CC & ROS.  No chief complaint on file.   Destiny Rangel is a 40 y.o. female that is presenting with acute right knee pain.  No inciting event or trauma.  Pain is worse with getting up from a seated position.   Review of Systems See HPI   HISTORY: Past Medical, Surgical, Social, and Family History Reviewed & Updated per EMR.   Pertinent Historical Findings include:  Past Medical History:  Diagnosis Date   ADHD (attention deficit hyperactivity disorder)    Allergy     Past Surgical History:  Procedure Laterality Date   CERVICAL CONIZATION W/BX N/A 12/02/2015   Procedure: CONIZATION CERVIX WITH BIOPSY;  Surgeon: Ok Edwards, MD;  Location: [facility];  Service: Gynecology;  Laterality: N/A;   WISDOM TOOTH EXTRACTION      Family History  Problem Relation Age of Onset   Parkinsonism Maternal Grandmother        dementia   Cancer Maternal Grandfather    Alcohol abuse Other    Depression Other    Cancer Mother        skin   Cancer Father        skin    Social History   Socioeconomic History   Marital status: Married    Spouse name: Not on file   Number of children: Not on file   Years of education: Not on file   Highest education level: Not on file  Occupational History   Not on file  Tobacco Use   Smoking status: Some Days    Types: Cigarettes   Smokeless tobacco: Never  Substance and Sexual Activity   Alcohol use: Yes    Comment: socially   Drug use: No   Sexual activity: Yes    Partners: Male    Birth control/protection: Condom    Comment: INTERCOURSE AGE 76, MORE THAN 5  Other Topics Concern   Not on file  Social History Narrative   Not on file   Social Determinants of Health   Financial Resource Strain: Not on file  Food Insecurity: Not on file  Transportation Needs: Not on file  Physical Activity: Not on file  Stress: Not on file  Social Connections:  Not on file  Intimate Partner Violence: Not on file     PHYSICAL EXAM:  VS: BP (!) 180/100 (BP Location: Left Arm, Patient Position: Sitting)    Ht 5' 5.5" (1.664 m)    Wt 163 lb (73.9 kg)    BMI 26.71 kg/m  Physical Exam Gen: NAD, alert, cooperative with exam, well-appearing   Limited ultrasound: Right knee:  Mild to moderate effusion suprapatellar pouch. Normal-appearing quadricep tendon. The patella seems to be high riding to suggest a patella alta No significant degenerative changes of the medial lateral compartment  Summary: Findings consistent with patella alta  Ultrasound and interpretation by Clare Gandy, MD     ASSESSMENT & PLAN:   Knee effusion, right Patella seems to be high riding which may be contributing to her pain.  Effusion was appreciated on exam as well. -Counseled on home exercise therapy and supportive care. -Prednisone. -X-ray. -Could consider injection or physical therapy.

## 2021-12-01 NOTE — Patient Instructions (Signed)
Nice to meet you Please use ice as needed  Please try the exercises  I will call with the results.   Please send me a message in MyChart with any questions or updates.  Please see me back in 4 weeks.   --Dr. Jordan Likes

## 2021-12-03 ENCOUNTER — Telehealth: Payer: Self-pay | Admitting: Family Medicine

## 2021-12-03 NOTE — Telephone Encounter (Signed)
Informed of results.   Myra Rude, MD [facility] 12/03/2021, 2:49 PM

## 2021-12-07 ENCOUNTER — Other Ambulatory Visit: Payer: Self-pay | Admitting: Family Medicine

## 2021-12-07 ENCOUNTER — Encounter: Payer: Self-pay | Admitting: Family Medicine

## 2021-12-07 DIAGNOSIS — R251 Tremor, unspecified: Secondary | ICD-10-CM

## 2021-12-07 DIAGNOSIS — F988 Other specified behavioral and emotional disorders with onset usually occurring in childhood and adolescence: Secondary | ICD-10-CM

## 2021-12-07 NOTE — Telephone Encounter (Signed)
Requesting: alprazolam and adderall Contract: 03/30/21 UDS: 03/30/21 Last Visit: 03/30/21 Next Visit: none Last Refill: alprazolam 09/25/21 adderall 10/23/21  Please Advise

## 2021-12-08 ENCOUNTER — Other Ambulatory Visit: Payer: Self-pay | Admitting: Family Medicine

## 2021-12-08 DIAGNOSIS — F988 Other specified behavioral and emotional disorders with onset usually occurring in childhood and adolescence: Secondary | ICD-10-CM

## 2021-12-08 MED ORDER — AMPHETAMINE-DEXTROAMPHETAMINE 10 MG PO TABS
ORAL_TABLET | ORAL | 0 refills | Status: DC
Start: 2021-12-08 — End: 2022-01-11

## 2021-12-08 NOTE — Telephone Encounter (Signed)
pt has an appointment scheduled 12/22. ALPRAZolam (XANAX) 0.25 MG tablet [[id number]] [id number] - [place], [place] - [address] AT [facility] [address]  [address], [place] [address]  Phone:  [phone]  Fax:  [phone]

## 2021-12-09 MED ORDER — ALPRAZOLAM 0.25 MG PO TABS
0.2500 mg | ORAL_TABLET | Freq: Three times a day (TID) | ORAL | 0 refills | Status: DC | PRN
Start: 2021-12-09 — End: 2022-06-28

## 2021-12-09 NOTE — Telephone Encounter (Signed)
Requesting: Xanax Contract: 03/30/2021 UDS: 03/30/2021 Last OV: 03/30/2021 Next OV: 12/10/2021 Last Refill: 09/25/2021, #30--0 RF Database:   Please advise

## 2021-12-10 ENCOUNTER — Encounter: Payer: Self-pay | Admitting: Family Medicine

## 2021-12-10 ENCOUNTER — Ambulatory Visit: Payer: BC Managed Care – PPO | Admitting: Family Medicine

## 2021-12-10 VITALS — BP 138/80 | HR 111 | Temp 98.6°F | Resp 18 | Ht 65.5 in | Wt 166.8 lb

## 2021-12-10 DIAGNOSIS — F988 Other specified behavioral and emotional disorders with onset usually occurring in childhood and adolescence: Secondary | ICD-10-CM | POA: Diagnosis not present

## 2021-12-10 MED ORDER — AMPHETAMINE-DEXTROAMPHETAMINE 10 MG PO TABS
10.0000 mg | ORAL_TABLET | Freq: Two times a day (BID) | ORAL | 0 refills | Status: DC
Start: 2021-12-10 — End: 2021-12-17

## 2021-12-10 NOTE — Progress Notes (Signed)
Subjective:   By signing my name below, I, [name], attest that this documentation has been prepared under the direction and in the presence of Dr. Seabron Spates, DO. 12/10/2021    Patient ID: Destiny Rangel, female    DOB: 1981/03/03, 40 y.o.   MRN: [id number]  Chief Complaint  Patient presents with   ADD   Follow-up    HPI Patient is in today for a office visit.  She is requesting a refill on 10 mg adderall 2x daily PO. She reports no new issues while taking it.    Past Medical History:  Diagnosis Date   ADHD (attention deficit hyperactivity disorder)    Allergy     Past Surgical History:  Procedure Laterality Date   CERVICAL CONIZATION W/BX N/A 12/02/2015   Procedure: CONIZATION CERVIX WITH BIOPSY;  Surgeon: Ok Edwards, MD;  Location: [facility];  Service: Gynecology;  Laterality: N/A;   WISDOM TOOTH EXTRACTION      Family History  Problem Relation Age of Onset   Parkinsonism Maternal Grandmother        dementia   Cancer Maternal Grandfather    Alcohol abuse Other    Depression Other    Cancer Mother        skin   Cancer Father        skin    Social History   Socioeconomic History   Marital status: Married    Spouse name: Not on file   Number of children: Not on file   Years of education: Not on file   Highest education level: Not on file  Occupational History   Not on file  Tobacco Use   Smoking status: Some Days    Types: Cigarettes   Smokeless tobacco: Never  Substance and Sexual Activity   Alcohol use: Yes    Comment: socially   Drug use: No   Sexual activity: Yes    Partners: Male    Birth control/protection: Condom    Comment: INTERCOURSE AGE 76, MORE THAN 5  Other Topics Concern   Not on file  Social History Narrative   Not on file   Social Determinants of Health   Financial Resource Strain: Not on file  Food Insecurity: Not on file  Transportation Needs: Not on file  Physical Activity: Not on file  Stress: Not on  file  Social Connections: Not on file  Intimate Partner Violence: Not on file    Outpatient Medications Prior to Visit  Medication Sig Dispense Refill   ALPRAZolam (XANAX) 0.25 MG tablet Take 1 tablet (0.25 mg total) by mouth 3 (three) times daily as needed. 30 tablet 0   amphetamine-dextroamphetamine (ADDERALL) 10 MG tablet TAKE 1 TABLET BY MOUTH EVERY MORNING AND 2 TABLETS EVERY EVENING 90 tablet 0   Azelaic Acid 15 % cream After skin is thoroughly washed and patted dry, gently but thoroughly massage a thin film of azelaic acid cream into the affected area twice daily, in the morning and evening. 30 g 0   betamethasone valerate ointment (VALISONE) 0.1 % Apply 1 application topically 2 (two) times daily. 30 g 0   fluticasone (FLONASE) 50 MCG/ACT nasal spray USE 2 SPRAYS IN EACH NOSTRIL DAILY 48 g 3   predniSONE (DELTASONE) 5 MG tablet Take 6 pills for first day, 5 pills second day, 4 pills third day, 3 pills fourth day, 2 pills the fifth day, and 1 pill sixth day. (Patient not taking: Reported on 12/10/2021) 21 tablet 0  No facility-administered medications prior to visit.    No Known Allergies  ROS     Objective:    Physical Exam Constitutional:      General: She is not in acute distress.    Appearance: Normal appearance. She is not ill-appearing.  HENT:     Head: Normocephalic and atraumatic.     Right Ear: External ear normal.     Left Ear: External ear normal.  Eyes:     Extraocular Movements: Extraocular movements intact.     Pupils: Pupils are equal, round, and reactive to light.  Cardiovascular:     Rate and Rhythm: Normal rate and regular rhythm.     Heart sounds: Normal heart sounds. No murmur heard.   No gallop.  Pulmonary:     Effort: Pulmonary effort is normal. No respiratory distress.     Breath sounds: Normal breath sounds. No wheezing or rales.  Skin:    General: Skin is warm and dry.  Neurological:     Mental Status: She is alert and oriented to person,  place, and time.  Psychiatric:        Behavior: Behavior normal.        Judgment: Judgment normal.    BP 138/80 (BP Location: Left Arm, Patient Position: Sitting, Cuff Size: Normal)    Pulse (!) 111    Temp 98.6 F (37 C) (Oral)    Resp 18    Ht 5' 5.5" (1.664 m)    Wt 166 lb 12.8 oz (75.7 kg)    SpO2 100%    BMI 27.33 kg/m  Wt Readings from Last 3 Encounters:  12/10/21 166 lb 12.8 oz (75.7 kg)  12/01/21 163 lb (73.9 kg)  10/05/21 163 lb (73.9 kg)    Diabetic Foot Exam - Simple   No data filed    Lab Results  Component Value Date   WBC 7.9 10/27/2018   HGB 13.9 10/27/2018   HCT 40.8 10/27/2018   PLT 282.0 10/27/2018   GLUCOSE 74 10/27/2018   CHOL 180 10/27/2018   TRIG 57.0 10/27/2018   HDL 78.90 10/27/2018   LDLCALC 90 10/27/2018   ALT 22 10/27/2018   AST 23 10/27/2018   NA 135 10/27/2018   K 4.2 10/27/2018   CL 102 10/27/2018   CREATININE 0.59 10/27/2018   BUN 12 10/27/2018   CO2 26 10/27/2018   TSH 3.15 10/27/2018    Lab Results  Component Value Date   TSH 3.15 10/27/2018   Lab Results  Component Value Date   WBC 7.9 10/27/2018   HGB 13.9 10/27/2018   HCT 40.8 10/27/2018   MCV 94.7 10/27/2018   PLT 282.0 10/27/2018   Lab Results  Component Value Date   NA 135 10/27/2018   K 4.2 10/27/2018   CO2 26 10/27/2018   GLUCOSE 74 10/27/2018   BUN 12 10/27/2018   CREATININE 0.59 10/27/2018   BILITOT 0.5 10/27/2018   ALKPHOS 45 10/27/2018   AST 23 10/27/2018   ALT 22 10/27/2018   PROT 7.3 10/27/2018   ALBUMIN 4.6 10/27/2018   CALCIUM 9.4 10/27/2018   GFR 121.01 10/27/2018   Lab Results  Component Value Date   CHOL 180 10/27/2018   Lab Results  Component Value Date   HDL 78.90 10/27/2018   Lab Results  Component Value Date   LDLCALC 90 10/27/2018   Lab Results  Component Value Date   TRIG 57.0 10/27/2018   Lab Results  Component Value Date   CHOLHDL 2  10/27/2018   No results found for: HGBA1C     Assessment & Plan:   Problem List  Items Addressed This Visit       Unprioritized   Attention deficit disorder (ADD) without hyperactivity - Primary    Stable Refill adderall  F/u 6 months Uds, contract utd Database reviewed       Relevant Medications   amphetamine-dextroamphetamine (ADDERALL) 10 MG tablet   amphetamine-dextroamphetamine (ADDERALL) 10 MG tablet     Meds ordered this encounter  Medications   amphetamine-dextroamphetamine (ADDERALL) 10 MG tablet    Sig: Take 1 tablet (10 mg total) by mouth 2 (two) times daily.    Dispense:  60 tablet    Refill:  0    Do not fill until Feb 2023   amphetamine-dextroamphetamine (ADDERALL) 10 MG tablet    Sig: Take 1 tablet (10 mg total) by mouth 2 (two) times daily.    Dispense:  60 tablet    Refill:  0    Do not fill until Jan 2023    I, Dr. Seabron Spates, DO, personally preformed the services described in this documentation.  All medical record entries made by the scribe were at my direction and in my presence.  I have reviewed the chart and discharge instructions (if applicable) and agree that the record reflects my personal performance and is accurate and complete.  12/10/2021   I,[name],acting as a scribe for Donato Schultz, DO.,have documented all relevant documentation on the behalf of Donato Schultz, DO,as directed by  Donato Schultz, DO while in the presence of Donato Schultz, DO.  Donato Schultz, DO

## 2021-12-10 NOTE — Patient Instructions (Signed)
Living With Attention Deficit Hyperactivity Disorder If you have been diagnosed with attention deficit hyperactivity disorder (ADHD), you may be relieved that you now know why you have felt or behaved a certain way. Still, you may feel overwhelmed about the treatment ahead. You may also wonder how to get the support you need and how to deal with the condition day-to-day. With treatment and support, you can live with ADHD and manage your symptoms. How to manage lifestyle changes Managing stress Stress is your body's reaction to life changes and events, both good and bad. To cope with the stress of an ADHD diagnosis, it may help to: Learn more about ADHD. Exercise regularly. Even a short daily walk can lower stress levels. Participate in training or education programs (including social skills training classes) that teach you to deal with symptoms.  Medicines Your health care provider may suggest certain medicines if he or she feels that they will help to improve your condition. Stimulant medicines are usually prescribed to treat ADHD, and therapy may also be prescribed. It is important to: Avoid using alcohol and other substances that may prevent your medicines from working properly (may interact). Talk with your pharmacist or health care provider about all the medicines that you take, their possible side effects, and what medicines are safe to take together. Make it your goal to take part in all treatment decisions (shared decision-making). Ask about possible side effects of medicines that your health care provider recommends, and tell him or her how you feel about having those side effects. It is best if shared decision-making with your health care provider is part of your total treatment plan. Relationships To strengthen your relationships with family members while treating your condition, consider taking part in family therapy. You might also attend self-help groups alone or with a loved one. Be  honest about how your symptoms affect your relationships. Make an effort to communicate respectfully instead of fighting, and find ways to show others that you care. Psychotherapy may be useful in helping you cope with how ADHD affects your relationships. How to recognize changes in your condition The following signs may mean that your treatment is working well and your condition is improving: Consistently being on time for appointments. Being more organized at home and work. Other people noticing improvements in your behavior. Achieving goals that you set for yourself. Thinking more clearly. The following signs may mean that your treatment is not working very well: Feeling impatience or more confusion. Missing, forgetting, or being late for appointments. An increasing sense of disorganization and messiness. More difficulty in reaching goals that you set for yourself. Loved ones becoming angry or frustrated with you. Follow these instructions at home: Take over-the-counter and prescription medicines only as told by your health care provider. Check with your health care provider before taking any new medicines. Create structure and an organized atmosphere at home. For example: Make a list of tasks, then rank them from most important to least important. Work on one task at a time until your listed tasks are done. Make a daily schedule and follow it consistently every day. Use an appointment calendar, and check it 2 or 3 times a day to keep on track. Keep it with you when you leave the house. Create spaces where you keep certain things, and always put things back in their places after you use them. Keep all follow-up visits as told by your health care provider. This is important. Where to find support Talking to others    Keep emotion out of important discussions and speak in a calm, logical way. Listen closely and patiently to your loved ones. Try to understand their point of view, and try to  avoid getting defensive. Take responsibility for the consequences of your actions. Ask that others do not take your behaviors personally. Aim to solve problems as they come up, and express your feelings instead of bottling them up. Talk openly about what you need from your loved ones and how they can support you. Consider going to family therapy sessions or having your family meet with a specialist who deals with ADHD-related behavior problems. Finances Not all insurance plans cover mental health care, so it is important to check with your insurance carrier. If paying for co-pays or counseling services is a problem, search for a local or county mental health care center. Public mental health care services may be offered there at a low cost or no cost when you are not able to see a private health care provider. If you are taking medicine for ADHD, you may be able to get the generic form, which may be less expensive than brand-name medicine. Some makers of prescription medicines also offer help to patients who cannot afford the medicines that they need. Questions to ask your health care provider: What are the risks and benefits of taking medicines? Would I benefit from therapy? How often should I follow up with a health care provider? Contact a health care provider if: You have side effects from your medicines, such as: Repeated muscle twitches, coughing, or speech outbursts. Sleep problems. Loss of appetite. Depression. New or worsening behavior problems. Dizziness. Unusually fast heartbeat. Stomach pains. Headaches. Get help right away if: You have a severe reaction to a medicine. Your behavior suddenly gets worse. Summary With treatment and support, you can live with ADHD and manage your symptoms. The medicines that are most often prescribed for ADHD are stimulants. Consider taking part in family therapy or self-help groups with family members or friends. When you talk with friends  and family about your ADHD, be patient and communicate openly. Take over-the-counter and prescription medicines only as told by your health care provider. Check with your health care provider before taking any new medicines. This information is not intended to replace advice given to you by your health care provider. Make sure you discuss any questions you have with your health care provider. Document Revised: 05/21/2020 Document Reviewed: 05/21/2020 Elsevier Patient Education  2022 [employer]  

## 2021-12-10 NOTE — Assessment & Plan Note (Signed)
Stable Refill adderall  F/u 6 months Uds, contract utd Database reviewed

## 2021-12-17 ENCOUNTER — Other Ambulatory Visit: Payer: Self-pay

## 2021-12-17 ENCOUNTER — Encounter: Payer: Self-pay | Admitting: Obstetrics and Gynecology

## 2021-12-17 ENCOUNTER — Ambulatory Visit (INDEPENDENT_AMBULATORY_CARE_PROVIDER_SITE_OTHER): Payer: BC Managed Care – PPO

## 2021-12-17 ENCOUNTER — Ambulatory Visit: Payer: BC Managed Care – PPO | Admitting: Obstetrics and Gynecology

## 2021-12-17 VITALS — BP 118/68 | HR 73 | Ht 66.0 in | Wt 165.0 lb

## 2021-12-17 DIAGNOSIS — Z3689 Encounter for other specified antenatal screening: Secondary | ICD-10-CM

## 2021-12-17 DIAGNOSIS — Z3201 Encounter for pregnancy test, result positive: Secondary | ICD-10-CM

## 2021-12-17 DIAGNOSIS — N926 Irregular menstruation, unspecified: Secondary | ICD-10-CM

## 2021-12-17 DIAGNOSIS — Z8742 Personal history of other diseases of the female genital tract: Secondary | ICD-10-CM

## 2021-12-17 LAB — PREGNANCY, URINE: Preg Test, Ur: POSITIVE — AB

## 2021-12-17 NOTE — Progress Notes (Signed)
GYNECOLOGY  VISIT   HPI: 40 y.o.   Married White or Caucasian Not Hispanic or Latino  female   [id number] with Patient's last menstrual period was 11/06/2021.   here for pregnancy conformation.   LMP was 11/06/21, normal cycle, cycles are 28 days. She had a +UPT on 12/15/21.  She has been having unprotected intercourse for ~5-6 without pregnancy. No h/o GC/CT.  She is feeling fine. She is having breast tenderness. No nausea. No pain. No bleeding.  She hasn't taken any of her medications for a couple of days.   GYNECOLOGIC HISTORY: Patient's last menstrual period was 11/06/2021. Contraception:none Menopausal hormone therapy: none        OB History     Gravida  0   Para  0   Term  0   Preterm  0   AB  0   Living  0      SAB  0   IAB  0   Ectopic  0   Multiple  0   Live Births           Obstetric Comments  00            Patient Active Problem List   Diagnosis Date Noted   Knee effusion, right 12/01/2021   Rosacea 03/30/2021   Hyperhidrosis 03/30/2021   High risk medication use 03/30/2021   Attention deficit disorder (ADD) without hyperactivity 10/07/2017   CIN III (cervical intraepithelial neoplasia III) 10/24/2015   Abnormal cervical Pap smear with positive HPV DNA test 10/17/2015   Tremor 03/29/2013   Acne vulgaris 03/18/2010   TOBACCO ABUSE 08/16/2008   CARPAL TUNNEL SYNDROME, BILATERAL 01/29/2008   ADD (attention deficit disorder) 04/27/2007   ALLERGIC RHINITIS 04/27/2007   DERMATITIS, OTHER ATOPIC 04/27/2007    Past Medical History:  Diagnosis Date   ADHD (attention deficit hyperactivity disorder)    Allergy     Past Surgical History:  Procedure Laterality Date   CERVICAL CONIZATION W/BX N/A 12/02/2015   Procedure: CONIZATION CERVIX WITH BIOPSY;  Surgeon: Ok Edwards, MD;  Location: [facility];  Service: Gynecology;  Laterality: N/A;   WISDOM TOOTH EXTRACTION      Current Outpatient Medications  Medication Sig Dispense Refill    Azelaic Acid 15 % cream After skin is thoroughly washed and patted dry, gently but thoroughly massage a thin film of azelaic acid cream into the affected area twice daily, in the morning and evening. 30 g 0   betamethasone valerate ointment (VALISONE) 0.1 % Apply 1 application topically 2 (two) times daily. 30 g 0   ALPRAZolam (XANAX) 0.25 MG tablet Take 1 tablet (0.25 mg total) by mouth 3 (three) times daily as needed. (Patient not taking: Reported on 12/17/2021) 30 tablet 0   amphetamine-dextroamphetamine (ADDERALL) 10 MG tablet TAKE 1 TABLET BY MOUTH EVERY MORNING AND 2 TABLETS EVERY EVENING (Patient not taking: Reported on 12/17/2021) 90 tablet 0   fluticasone (FLONASE) 50 MCG/ACT nasal spray USE 2 SPRAYS IN EACH NOSTRIL DAILY (Patient not taking: Reported on 12/17/2021) 48 g 3   No current facility-administered medications for this visit.     ALLERGIES: Patient has no known allergies.  Family History  Problem Relation Age of Onset   Parkinsonism Maternal Grandmother        dementia   Cancer Maternal Grandfather    Alcohol abuse Other    Depression Other    Cancer Mother        skin   Cancer Father  skin    Social History   Socioeconomic History   Marital status: Married    Spouse name: Not on file   Number of children: Not on file   Years of education: Not on file   Highest education level: Not on file  Occupational History   Not on file  Tobacco Use   Smoking status: Some Days    Types: Cigarettes   Smokeless tobacco: Never  Substance and Sexual Activity   Alcohol use: Yes    Comment: socially   Drug use: No   Sexual activity: Yes    Partners: Male    Birth control/protection: Condom    Comment: INTERCOURSE AGE 69, MORE THAN 5  Other Topics Concern   Not on file  Social History Narrative   Not on file   Social Determinants of Health   Financial Resource Strain: Not on file  Food Insecurity: Not on file  Transportation Needs: Not on file  Physical  Activity: Not on file  Stress: Not on file  Social Connections: Not on file  Intimate Partner Violence: Not on file    Review of Systems  All other systems reviewed and are negative.  PHYSICAL EXAMINATION:    BP 118/68    Pulse 73    Ht 5\' 6"  (1.676 m)    Wt 165 lb (74.8 kg)    LMP 11/06/2021    SpO2 99%    BMI 26.63 kg/m     General appearance: alert, cooperative and appears stated age  89. Missed menses 28 day cycles, based on LMP she should be 5+[redacted] weeks pregnant - Pregnancy, urine: + - 11/08/2021 OB Transvaginal; Future -Start prenatal vitamins -Stay off of her medications -Information on AMA and medications safe to take in pregnancy were given -Names of OB's were given  2. History of infertility Discussed that this increases her risk of ectopic pregnancy. Given the upcoming 4 day holiday weekend, will get an ultrasound now. Given her gestational age, should be able to see a gestational sac and yolk sac - US OB Transvaginal; Future  Addendum: Ultrasound Gestational gas 1.2 cm c/w 6 weeks Yolk sac 0.52 cm CRL 0.42 cm c/w 6 weeks. Fetal Heart Rate 110 BPM Left ovary 2.47 x 2.47 cm Right ovary 2.59 x 2.59  Patient will establish care with OB  In addition to reviewing the ultrasound, over 20 minutes was spent in total patient care.

## 2021-12-17 NOTE — Patient Instructions (Addendum)
Pregnancy After Age 42 Women who become pregnant after the age of 21 have a higher risk for certain problems during pregnancy. This is because older women may already have health problems before becoming pregnant. Older women who are healthy before pregnancy may still develop problems during pregnancy. These problems may affect the mother, the unborn baby (fetus), or both. How does this affect me? If you are over age 58 and you want to become pregnant or are pregnant, you may have a higher risk of: Not being able to get pregnant (infertility). Going into labor early (preterm labor). Needing surgical delivery of your baby (cesarean delivery, or C-section). Having complications during pregnancy, such as high blood pressure and other symptoms (preeclampsia). Having diabetes during pregnancy (gestational diabetes). Being pregnant with more than one baby. Loss of the unborn baby before 20 weeks (miscarriage) or after 20 weeks of pregnancy (stillbirth). How does this affect my baby? Babies born to women over the age of 23 have a higher risk for: Being born early (prematurity). Low birth weight, which is less than 5 lb, 8 oz (2.5 kg). Birth defects, such as Down syndrome and cleft palate. Health complications, including problems with growth and development. Prenatal care All women should see their health care provider before they try to become pregnant. This is especially important for women over the age of 53. Tell your health care provider about: Any health problems you have. Any medicines you take. Any family history of health problems or chromosome-related defects. Any problems you have had with past surgeries, pregnancies, or deliveries. If you are over age 65 and you plan to become pregnant, start taking a daily multivitamin a month or more before you try to get pregnant. Your multivitamin should contain 400 mcg (micrograms) of folic acid. If you are over age 77 and pregnant, make sure  you: Keep taking your multivitamin unless your health care provider tells you not to take it. Keep all follow-up visits. This includes prenatal visits. This is important. Talk with your health care provider about other prenatal screening tests that you may need. Prenatal tests Screening tests show whether your baby has a higher risk for birth defects than other babies. Screening tests include: More frequent ultrasound tests to look for markers that indicate a risk for birth defects. Maternal blood screening. These are blood tests that help to determine your baby's risk for birth defects. Screening tests do not show whether your baby has or does not have birth defects. They only show your baby's risk for certain birth defects. If your screening tests show that risk factors are present, you may need tests to confirm the birth defect (diagnostic testing). These tests may include: Chorionic villus sampling. For this procedure, a tissue sample is taken from the organ that forms in your uterus to nourish your baby (placenta). The sample is removed through your cervix or abdomen and tested. Amniocentesis. For this procedure, a small amount of the fluid that surrounds the baby in the uterus (amniotic fluid) is removed and tested. You will also need screenings tests for gestational diabetes in the first trimester, as well as later in pregnancy. Staying healthy during pregnancy Staying healthy during pregnancy can help you and your baby to have a lower risk for problems during pregnancy, during delivery, or both. Talk with your health care provider for specific instructions about staying healthy during your pregnancy. General tips Nutrition  At each meal, eat a variety of foods from each of the five food groups. These  groups include: Proteins such as lean meats, poultry, fish that is low in fat, beans, eggs, and nuts. Vegetables such as leafy greens, raw and cooked vegetables, and vegetable juice. Fruits  that are fresh, frozen, or canned, or 100% fruit juice. Dairy products such as low-fat yogurt, cheese, and milk. Whole grains including rice, cereal, pasta, and bread. Follow instructions from your health care provider about eating and drinking restrictions during pregnancy. Do not eat raw eggs, raw meat, or raw fish or seafood. Do not eat any fish that contains high amounts of mercury, such as swordfish or mackerel. Managing weight gain Ask your health care provider how much weight gain is healthy during pregnancy. Stay at a healthy weight. If needed, work with your health care provider to lose weight safely. Exercise regularly, as directed by your health care provider. Ask your health care provider what forms of exercise are safe for you. Follow these instructions at home: Do not use any products that contain nicotine or tobacco. These products include cigarettes, chewing tobacco, and vaping devices, such as e-cigarettes. If you need help quitting, ask your health care provider. Drink enough fluid to keep your urine pale yellow. Do not drink alcohol, use drugs, or abuse prescription medicine. Take over-the-counter and prescription medicines only as told by your health care provider. Do not use hot tubs, steam rooms, or saunas. Talk with your health care provider about your risk of exposure to harmful environmental conditions. This includes exposure to chemicals, radiation, cleaning products, and cat feces. Follow advice from your health care provider about how to limit your exposure. Summary Women who become pregnant after the age of 62 have a higher risk for complications during pregnancy. Problems may affect the mother, the unborn baby (fetus), or both. All women should see their health care provider before they try to become pregnant. This is especially important for women over the age of 54. Staying healthy during pregnancy can help both you and your baby to have a lower risk for some of  the problems that can happen during pregnancy, during delivery, or both. This information is not intended to replace advice given to you by your health care provider. Make sure you discuss any questions you have with your health care provider. Document Revised: 08/25/2020 Document Reviewed: 08/25/2020 Elsevier Patient Education  [place].   Common Medications Safe in Pregnancy  Acne:      Constipation:  Benzoyl Peroxide     Colace  Clindamycin      Dulcolax Suppository  Topica Erythromycin     Fibercon  Salicylic Acid      Metamucil         Miralax AVOID:        Senakot   Accutane    Cough:  Retin-A       Cough Drops  Tetracycline      Phenergan w/ Codeine if Rx  Minocycline      Robitussin (Plain & DM)  Antibiotics:     Crabs/Lice:  Ceclor       RID  Cephalosporins    AVOID:  E-Mycins      Kwell  Keflex  Macrobid/Macrodantin   Diarrhea:  Penicillin      Kao-Pectate  Zithromax      Imodium AD         PUSH FLUIDS AVOID:       Cipro     Fever:  Tetracycline      Tylenol (Regular or Extra  Minocycline  Strength)  Levaquin      Extra Strength-Do not          Exceed 8 tabs/24 hrs Caffeine:        '200mg'$ /day (equiv. To 1 cup of coffee or  approx. 3 12 oz sodas)         Gas: Cold/Hayfever:       Gas-X  Benadryl      Mylicon  Claritin       Phazyme  **Claritin-D        Chlor-Trimeton    Headaches:  Dimetapp      ASA-Free Excedrin  Drixoral-Non-Drowsy     Cold Compress  Mucinex (Guaifenasin)     Tylenol (Regular or Extra  Sudafed/Sudafed-12 Hour     Strength)  **Sudafed PE Pseudoephedrine   Tylenol Cold & Sinus     Vicks Vapor Rub  Zyrtec  **AVOID if Problems With Blood Pressure         Heartburn: Avoid lying down for at least 1 hour after meals  Aciphex      Maalox     Rash:  Milk of Magnesia     Benadryl    Mylanta       1% Hydrocortisone Cream  Pepcid  Pepcid Complete   Sleep  Aids:  Prevacid      Ambien   Prilosec       Benadryl  Rolaids       Chamomile Tea  Tums (Limit 4/day)     Unisom         Tylenol PM         Warm milk-add vanilla or  Hemorrhoids:       Sugar for taste  Anusol/Anusol H.C.  (RX: Analapram 2.5%)  Sugar Substitutes:  Hydrocortisone OTC     Ok in moderation  Preparation H      Tucks        Vaseline lotion applied to tissue with wiping    Herpes:     Throat:  Acyclovir      Oragel  Famvir  Valtrex     Vaccines:         Flu Shot Leg Cramps:       *Gardasil  Benadryl      Hepatitis A         Hepatitis B Nasal Spray:       Pneumovax  Saline Nasal Spray     Polio Booster         Tetanus Nausea:       Tuberculosis test or PPD  Vitamin B6 25 mg TID   AVOID:    Dramamine      *Gardasil  Emetrol       Live Poliovirus  Ginger Root 250 mg QID    MMR (measles, mumps &  High Complex Carbs @ Bedtime    rebella)  Sea Bands-Accupressure    Varicella (Chickenpox)  Unisom 1/2 tab TID     *No known complications           If received before Pain:         Known pregnancy;   Darvocet       Resume series after  Lortab        Delivery  Percocet    Yeast:   Tramadol      Femstat  Tylenol 3      Gyne-lotrimin  Ultram       Monistat  Vicodin           MISC:  All Sunscreens           Hair Coloring/highlights          Insect Repellant's          (Including DEET)         Mystic Tans  

## 2021-12-22 ENCOUNTER — Encounter: Payer: Self-pay | Admitting: Obstetrics and Gynecology

## 2021-12-29 ENCOUNTER — Ambulatory Visit: Payer: BC Managed Care – PPO | Admitting: Family Medicine

## 2021-12-29 ENCOUNTER — Encounter: Payer: Self-pay | Admitting: Family Medicine

## 2021-12-29 DIAGNOSIS — M25461 Effusion, right knee: Secondary | ICD-10-CM

## 2021-12-29 NOTE — Progress Notes (Signed)
°  Ahliyah Nienow - 41 y.o. female MRN [id number]  Date of birth: 07-11-1981  SUBJECTIVE:  Including CC & ROS.  No chief complaint on file.   Sybrina Laning is a 41 y.o. female that is following up for her knee pain.  The pain is intermittent in nature.  It is mild but has improved since the initial visit.   Review of Systems See HPI   HISTORY: Past Medical, Surgical, Social, and Family History Reviewed & Updated per EMR.   Pertinent Historical Findings include:  Past Medical History:  Diagnosis Date   ADHD (attention deficit hyperactivity disorder)    Allergy     Past Surgical History:  Procedure Laterality Date   CERVICAL CONIZATION W/BX N/A 12/02/2015   Procedure: CONIZATION CERVIX WITH BIOPSY;  Surgeon: Ok Edwards, MD;  Location: [facility];  Service: Gynecology;  Laterality: N/A;   WISDOM TOOTH EXTRACTION       PHYSICAL EXAM:  VS: BP 120/82 (BP Location: Left Arm, Patient Position: Sitting)    Ht 5\' 6"  (1.676 m)    Wt 165 lb (74.8 kg)    BMI 26.63 kg/m  Physical Exam Gen: NAD, alert, cooperative with exam, well-appearing MSK: Neurovascularly intact       ASSESSMENT & PLAN:   Knee effusion, right Pain is improved with measures thus far.  Still occurring intermittently. -Counseled on home exercise therapy and supportive care. -Could consider injection or further imaging.

## 2021-12-29 NOTE — Assessment & Plan Note (Signed)
Pain is improved with measures thus far.  Still occurring intermittently. -Counseled on home exercise therapy and supportive care. -Could consider injection or further imaging.

## 2022-01-11 ENCOUNTER — Other Ambulatory Visit: Payer: Self-pay | Admitting: Family Medicine

## 2022-01-11 DIAGNOSIS — O09511 Supervision of elderly primigravida, first trimester: Secondary | ICD-10-CM | POA: Diagnosis not present

## 2022-01-11 DIAGNOSIS — O3680X0 Pregnancy with inconclusive fetal viability, not applicable or unspecified: Secondary | ICD-10-CM | POA: Diagnosis not present

## 2022-01-11 DIAGNOSIS — O021 Missed abortion: Secondary | ICD-10-CM | POA: Diagnosis not present

## 2022-01-11 DIAGNOSIS — Z3A08 8 weeks gestation of pregnancy: Secondary | ICD-10-CM | POA: Diagnosis not present

## 2022-01-11 DIAGNOSIS — F988 Other specified behavioral and emotional disorders with onset usually occurring in childhood and adolescence: Secondary | ICD-10-CM

## 2022-01-11 MED ORDER — AMPHETAMINE-DEXTROAMPHETAMINE 10 MG PO TABS: ORAL_TABLET | ORAL | 0 refills | Status: DC

## 2022-01-11 NOTE — Telephone Encounter (Signed)
Requesting: adderall 10mg  Contract:  UDS: 03/30/21 Last Visit: 12/10/21 Next Visit: none Last Refill: 12/08/21  Please Advise

## 2022-01-12 DIAGNOSIS — O021 Missed abortion: Secondary | ICD-10-CM | POA: Diagnosis not present

## 2022-01-13 DIAGNOSIS — O021 Missed abortion: Secondary | ICD-10-CM | POA: Diagnosis not present

## 2022-01-13 DIAGNOSIS — Z3A08 8 weeks gestation of pregnancy: Secondary | ICD-10-CM | POA: Diagnosis not present

## 2022-02-16 ENCOUNTER — Other Ambulatory Visit: Payer: Self-pay | Admitting: Family Medicine

## 2022-02-16 DIAGNOSIS — F988 Other specified behavioral and emotional disorders with onset usually occurring in childhood and adolescence: Secondary | ICD-10-CM

## 2022-02-16 MED ORDER — AMPHETAMINE-DEXTROAMPHETAMINE 10 MG PO TABS: ORAL_TABLET | ORAL | 0 refills | Status: DC

## 2022-02-16 NOTE — Telephone Encounter (Signed)
Requesting: adderall 10 mg Contract:03/30/21 UDS:03/30/21 Last Visit:12/10/21 Next Visit:unknown Last Refill:01/11/22  Please Advise

## 2022-03-18 ENCOUNTER — Other Ambulatory Visit: Payer: Self-pay | Admitting: Family Medicine

## 2022-03-18 DIAGNOSIS — F988 Other specified behavioral and emotional disorders with onset usually occurring in childhood and adolescence: Secondary | ICD-10-CM

## 2022-03-18 MED ORDER — AMPHETAMINE-DEXTROAMPHETAMINE 10 MG PO TABS: ORAL_TABLET | ORAL | 0 refills | Status: DC

## 2022-03-18 NOTE — Telephone Encounter (Signed)
Requesting: Adderall 10 ?Contract: 03/30/21 ?UDS: 03/30/21 ?Last Visit: 12/10/21 ?Next Visit: none pending- mychart message sent for reminder for 6 m f/u ?Last Refill: 02/16/22 ? ?Please Advise ? ?

## 2022-03-24 IMAGING — DX DG KNEE COMPLETE 4+V*R*
4 series · 4 of 4 positions shown · non-contrast
Comparison: None.

CLINICAL DATA: Right knee pain.

EXAM:
RIGHT KNEE - COMPLETE 4+ VIEW

[knee ap]
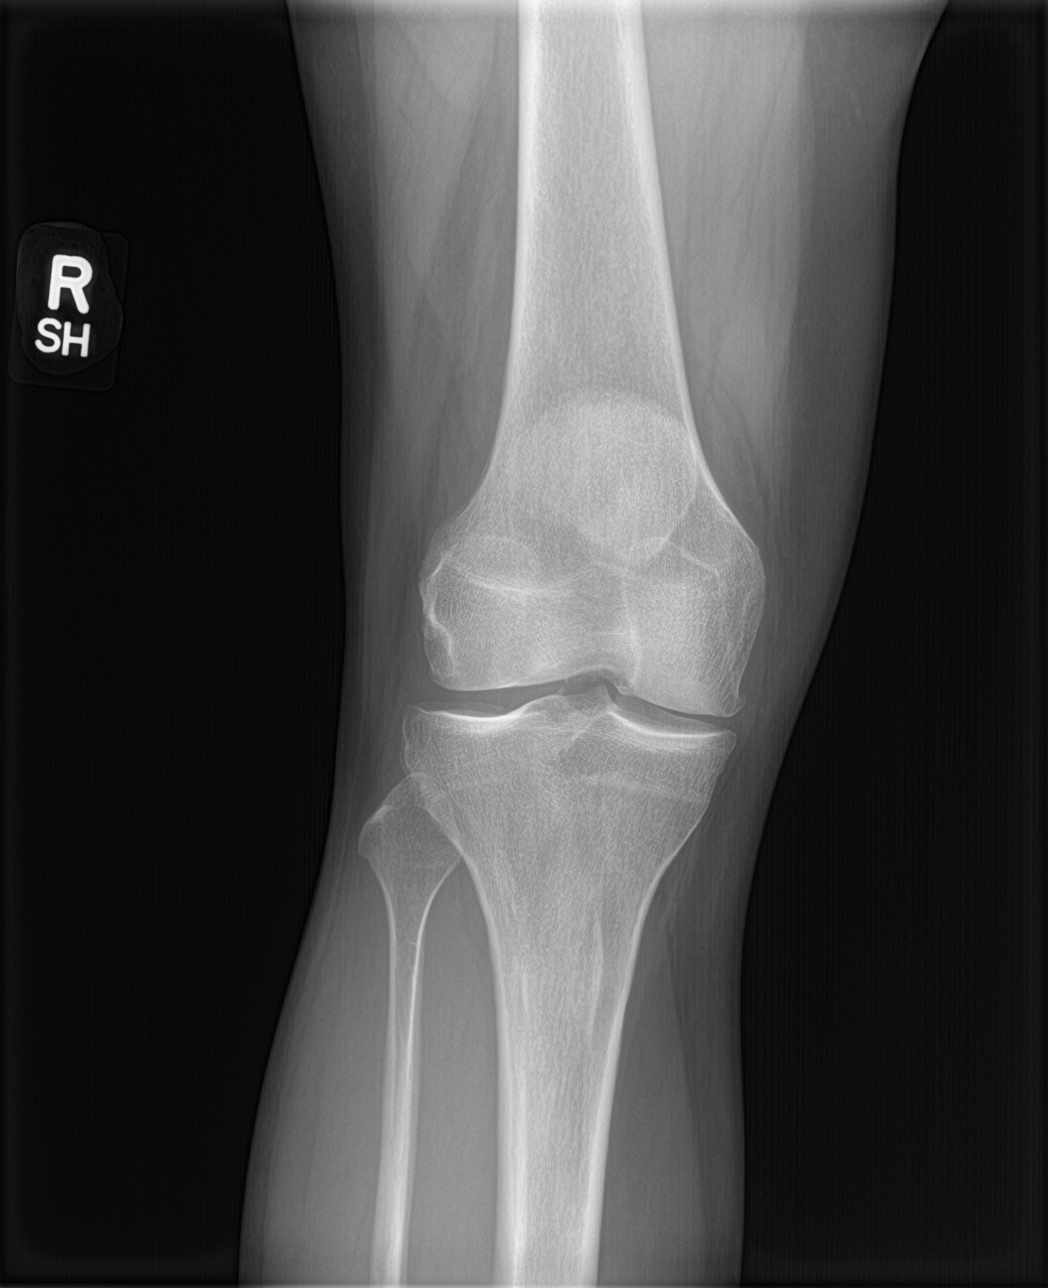

[tunnel]
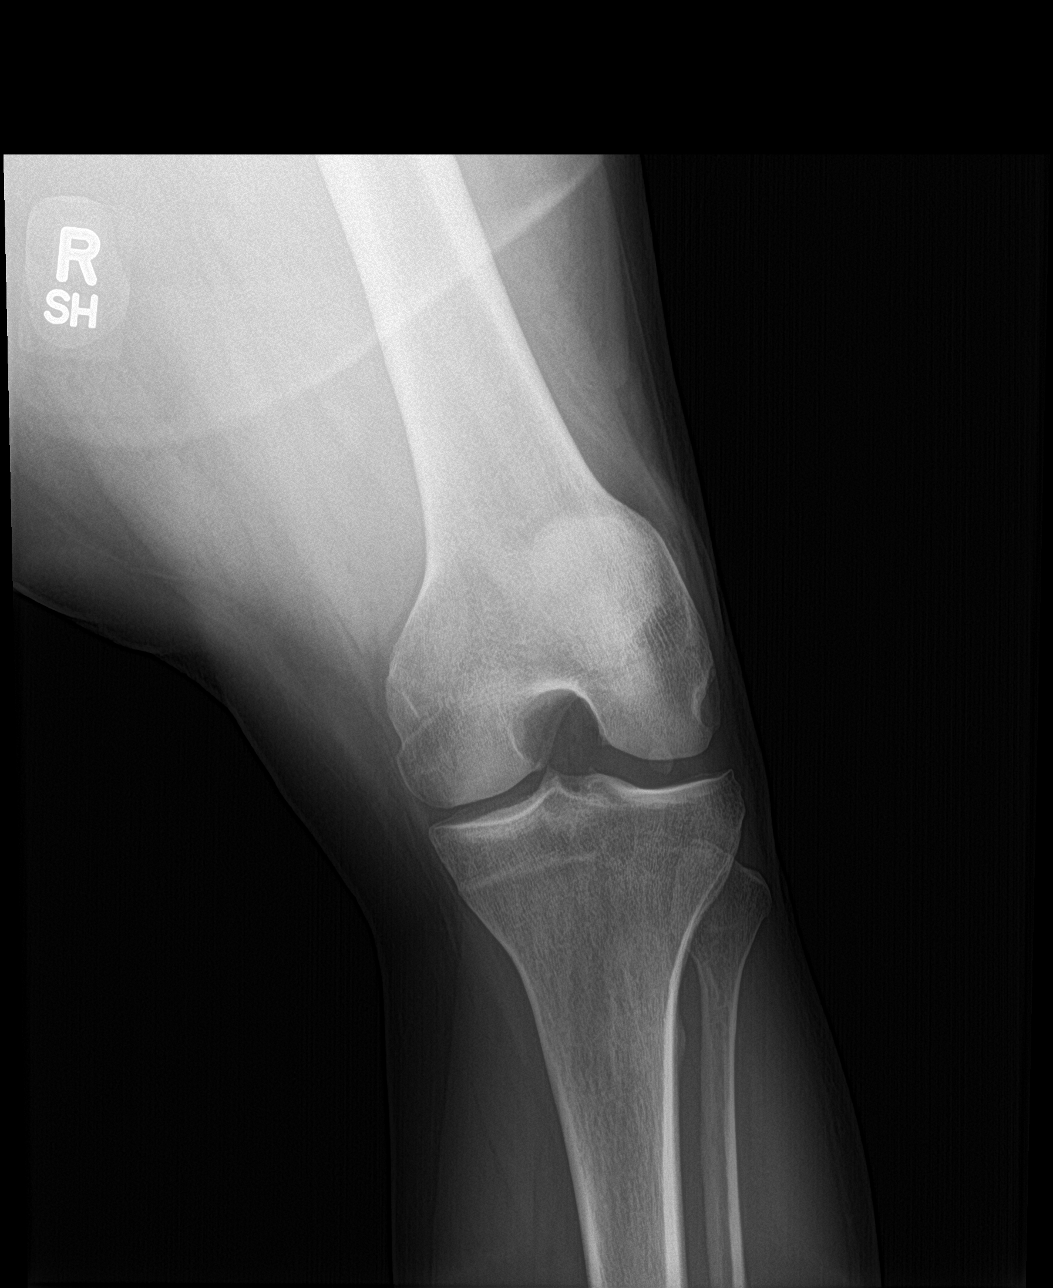

[knee lat]
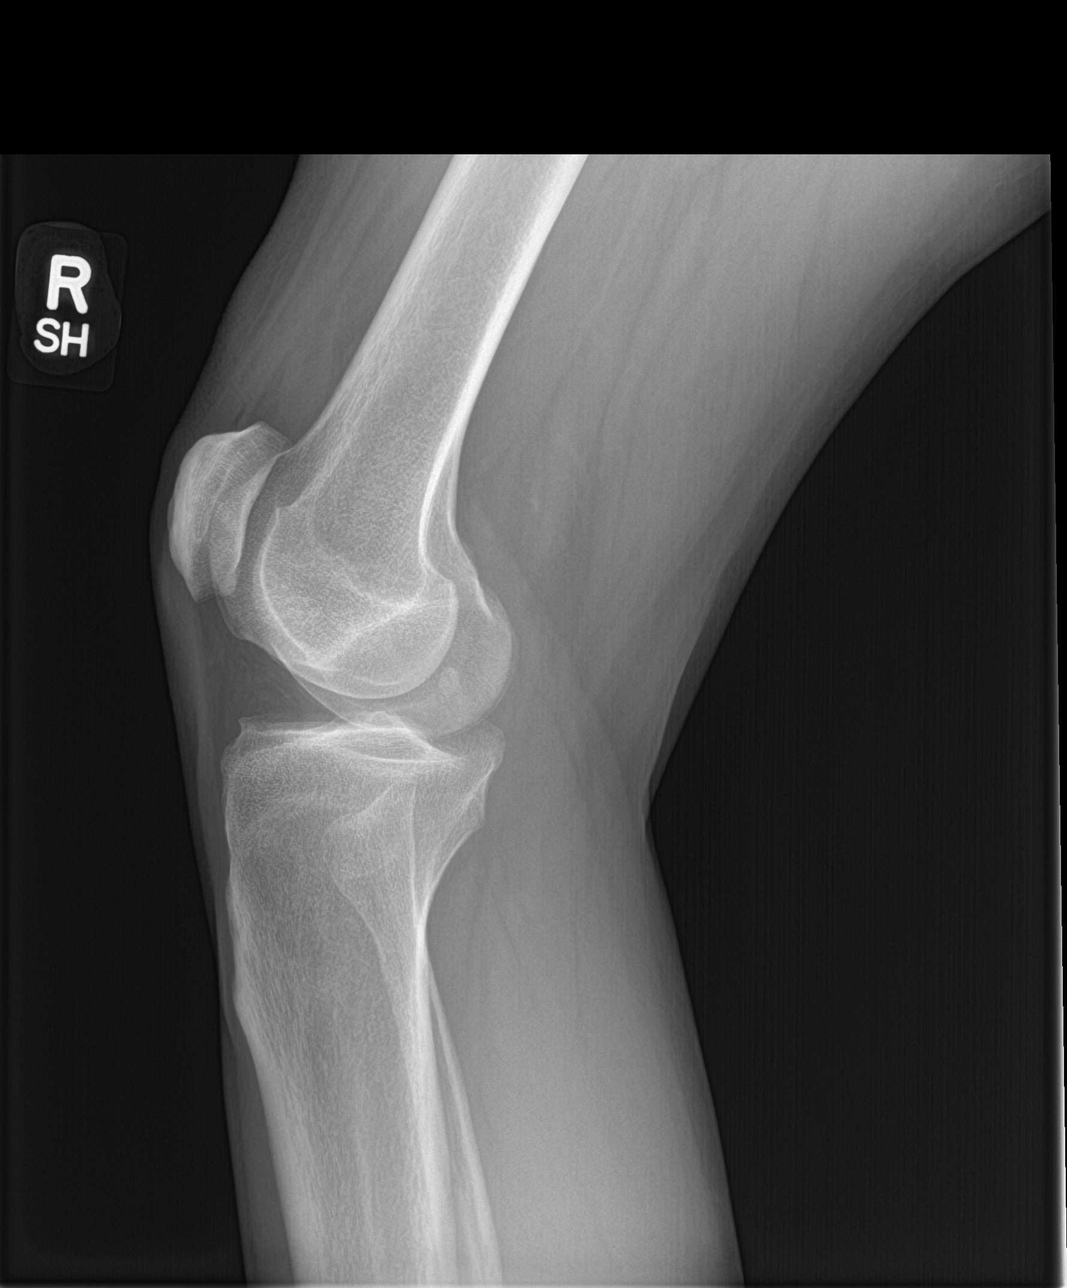

[knee sunrise]
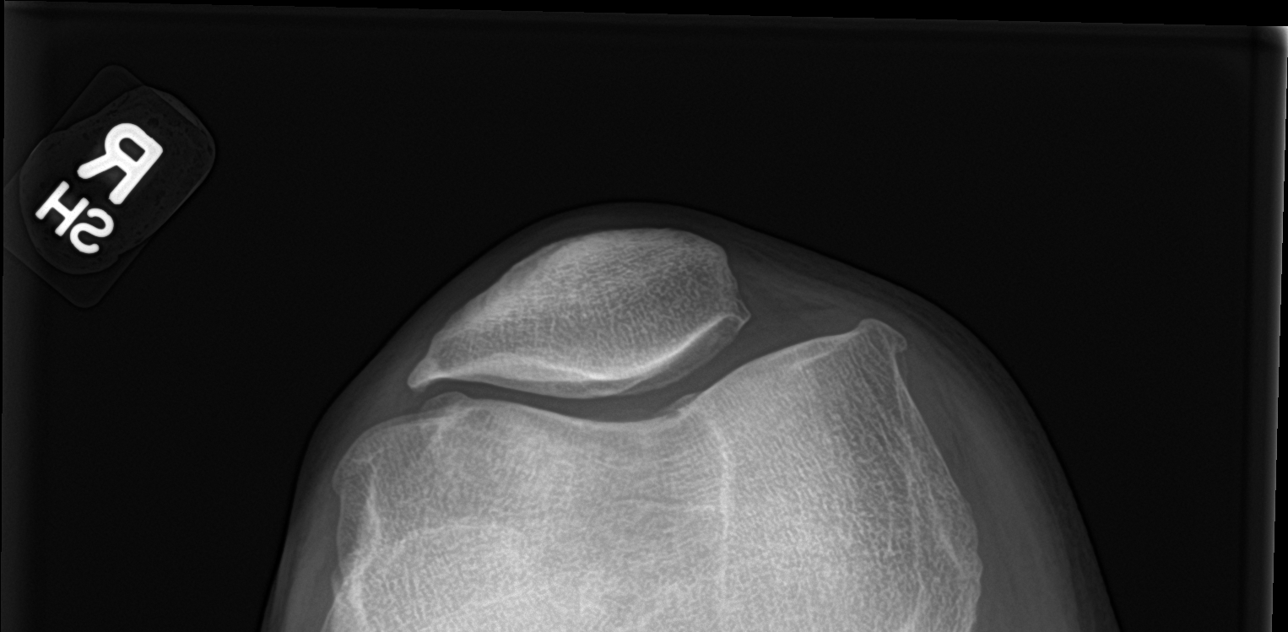

[4 of 4 positions shown; findings below may reference images not displayed]

FINDINGS: No acute fracture or dislocation. The bones are well mineralized.
Mild arthritic changes and narrowing of the medial compartment and
spurring. Small suprapatellar effusion. The soft tissues are
unremarkable.
IMPRESSION: 1. No acute fracture or dislocation.
2. Mild arthritic changes.

## 2022-04-13 DIAGNOSIS — Z1231 Encounter for screening mammogram for malignant neoplasm of breast: Secondary | ICD-10-CM | POA: Diagnosis not present

## 2022-04-13 LAB — HM MAMMOGRAPHY

## 2022-04-14 ENCOUNTER — Encounter: Payer: Self-pay | Admitting: *Deleted

## 2022-04-15 ENCOUNTER — Encounter: Payer: Self-pay | Admitting: Family Medicine

## 2022-04-15 ENCOUNTER — Ambulatory Visit: Payer: BC Managed Care – PPO | Admitting: Family Medicine

## 2022-04-15 VITALS — BP 100/70 | HR 92 | Temp 98.2°F | Resp 18 | Ht 66.0 in | Wt 163.8 lb

## 2022-04-15 DIAGNOSIS — F988 Other specified behavioral and emotional disorders with onset usually occurring in childhood and adolescence: Secondary | ICD-10-CM | POA: Diagnosis not present

## 2022-04-15 DIAGNOSIS — Z79899 Other long term (current) drug therapy: Secondary | ICD-10-CM | POA: Diagnosis not present

## 2022-04-15 MED ORDER — AMPHETAMINE-DEXTROAMPHETAMINE 10 MG PO TABS: ORAL_TABLET | ORAL | 0 refills | Status: DC

## 2022-04-15 NOTE — Progress Notes (Signed)
? ?Subjective:  ? ?By signing my name below, I, [name], attest that this documentation has been prepared under the direction and in the presence of Donato SchultzLowne Chase, Kendal Ghazarian R, DO. 04/15/2022 ?   ? ? Patient ID: Destiny Rangel, female    DOB: 09-27-1981, 41 y.o.   MRN: [id number] ? ?Chief Complaint  ?Patient presents with  ? ADD  ? Follow-up  ? ? ?HPI ?Patient is in today for a follow up visit.  ? ?She continues taking 10 mg Adderall 1x every morning and 2x every evening and reports no new issues while taking it. She is requesting a refill on it as well. She denies having any chest pain.  ? ? ?Past Medical History:  ?Diagnosis Date  ? ADHD (attention deficit hyperactivity disorder)   ? Allergy   ? ? ?Past Surgical History:  ?Procedure Laterality Date  ? CERVICAL CONIZATION W/BX N/A 12/02/2015  ? Procedure: CONIZATION CERVIX WITH BIOPSY;  Surgeon: Ok EdwardsJuan H Fernandez, MD;  Location: [facility];  Service: Gynecology;  Laterality: N/A;  ? WISDOM TOOTH EXTRACTION    ? ? ?Family History  ?Problem Relation Age of Onset  ? Parkinsonism Maternal Grandmother   ?     dementia  ? Cancer Maternal Grandfather   ? Alcohol abuse Other   ? Depression Other   ? Cancer Mother   ?     skin  ? Cancer Father   ?     skin  ? ? ?Social History  ? ?Socioeconomic History  ? Marital status: Married  ?  Spouse name: Not on file  ? Number of children: Not on file  ? Years of education: Not on file  ? Highest education level: Not on file  ?Occupational History  ? Not on file  ?Tobacco Use  ? Smoking status: Some Days  ?  Types: Cigarettes  ? Smokeless tobacco: Never  ?Substance and Sexual Activity  ? Alcohol use: Yes  ?  Comment: socially  ? Drug use: No  ? Sexual activity: Yes  ?  Partners: Male  ?  Birth control/protection: Condom  ?  Comment: INTERCOURSE AGE 52, MORE THAN 5  ?Other Topics Concern  ? Not on file  ?Social History Narrative  ? Not on file  ? ?Social Determinants of Health  ? ?Financial Resource Strain: Not on file  ?Food Insecurity: Not  on file  ?Transportation Needs: Not on file  ?Physical Activity: Not on file  ?Stress: Not on file  ?Social Connections: Not on file  ?Intimate Partner Violence: Not on file  ? ? ?Outpatient Medications Prior to Visit  ?Medication Sig Dispense Refill  ? Azelaic Acid 15 % cream After skin is thoroughly washed and patted dry, gently but thoroughly massage a thin film of azelaic acid cream into the affected area twice daily, in the morning and evening. 30 g 0  ? betamethasone valerate ointment (VALISONE) 0.1 % Apply 1 application topically 2 (two) times daily. 30 g 0  ? amphetamine-dextroamphetamine (ADDERALL) 10 MG tablet TAKE 1 TABLET BY MOUTH EVERY MORNING AND 2 TABLETS EVERY EVENING 90 tablet 0  ? ALPRAZolam (XANAX) 0.25 MG tablet Take 1 tablet (0.25 mg total) by mouth 3 (three) times daily as needed. (Patient not taking: Reported on 12/17/2021) 30 tablet 0  ? fluticasone (FLONASE) 50 MCG/ACT nasal spray USE 2 SPRAYS IN EACH NOSTRIL DAILY (Patient not taking: Reported on 12/17/2021) 48 g 3  ? ?No facility-administered medications prior to visit.  ? ? ?No Known  Allergies ? ?Review of Systems  ?Constitutional:  Negative for chills, fever and malaise/fatigue.  ?HENT:  Negative for congestion and hearing loss.   ?Eyes:  Negative for discharge.  ?Respiratory:  Negative for cough, sputum production and shortness of breath.   ?Cardiovascular:  Negative for chest pain, palpitations and leg swelling.  ?Gastrointestinal:  Negative for abdominal pain, blood in stool, constipation, diarrhea, heartburn, nausea and vomiting.  ?Genitourinary:  Negative for dysuria, frequency, hematuria and urgency.  ?Musculoskeletal:  Negative for back pain, falls and myalgias.  ?Skin:  Negative for rash.  ?Neurological:  Negative for dizziness, sensory change, loss of consciousness, weakness and headaches.  ?Endo/Heme/Allergies:  Negative for environmental allergies. Does not bruise/bleed easily.  ?Psychiatric/Behavioral:  Negative for  depression and suicidal ideas. The patient is not nervous/anxious and does not have insomnia.   ? ? ?   ?Objective:  ?  ?Physical Exam ?Vitals and nursing note reviewed.  ?Constitutional:   ?   General: She is not in acute distress. ?   Appearance: Normal appearance. She is well-developed. She is not ill-appearing.  ?HENT:  ?   Head: Normocephalic and atraumatic.  ?   Right Ear: External ear normal.  ?   Left Ear: External ear normal.  ?Eyes:  ?   Extraocular Movements: Extraocular movements intact.  ?   Conjunctiva/sclera: Conjunctivae normal.  ?   Pupils: Pupils are equal, round, and reactive to light.  ?Neck:  ?   Thyroid: No thyromegaly.  ?   Vascular: No carotid bruit or JVD.  ?Cardiovascular:  ?   Rate and Rhythm: Normal rate and regular rhythm.  ?   Heart sounds: Normal heart sounds. No murmur heard. ?  No gallop.  ?Pulmonary:  ?   Effort: Pulmonary effort is normal. No respiratory distress.  ?   Breath sounds: Normal breath sounds. No wheezing or rales.  ?Chest:  ?   Chest wall: No tenderness.  ?Musculoskeletal:  ?   Cervical back: Normal range of motion and neck supple.  ?Skin: ?   General: Skin is warm and dry.  ?Neurological:  ?   Mental Status: She is alert and oriented to person, place, and time.  ?Psychiatric:     ?   Judgment: Judgment normal.  ? ? ?BP 100/70 (BP Location: Left Arm, Patient Position: Sitting, Cuff Size: Normal)   Pulse 92   Temp 98.2 ?F (36.8 ?C) (Oral)   Resp 18   Ht 5\' 6"  (1.676 m)   Wt 163 lb 12.8 oz (74.3 kg)   SpO2 98%   BMI 26.44 kg/m?  ?Wt Readings from Last 3 Encounters:  ?04/15/22 163 lb 12.8 oz (74.3 kg)  ?12/29/21 165 lb (74.8 kg)  ?12/17/21 165 lb (74.8 kg)  ? ? ?Diabetic Foot Exam - Simple   ?No data filed ?  ? ?Lab Results  ?Component Value Date  ? WBC 7.9 10/27/2018  ? HGB 13.9 10/27/2018  ? HCT 40.8 10/27/2018  ? PLT 282.0 10/27/2018  ? GLUCOSE 74 10/27/2018  ? CHOL 180 10/27/2018  ? TRIG 57.0 10/27/2018  ? HDL 78.90 10/27/2018  ? LDLCALC 90 10/27/2018  ? ALT 22  10/27/2018  ? AST 23 10/27/2018  ? NA 135 10/27/2018  ? K 4.2 10/27/2018  ? CL 102 10/27/2018  ? CREATININE 0.59 10/27/2018  ? BUN 12 10/27/2018  ? CO2 26 10/27/2018  ? TSH 3.15 10/27/2018  ? ? ?Lab Results  ?Component Value Date  ? TSH 3.15 10/27/2018  ? ?Lab  Results  ?Component Value Date  ? WBC 7.9 10/27/2018  ? HGB 13.9 10/27/2018  ? HCT 40.8 10/27/2018  ? MCV 94.7 10/27/2018  ? PLT 282.0 10/27/2018  ? ?Lab Results  ?Component Value Date  ? NA 135 10/27/2018  ? K 4.2 10/27/2018  ? CO2 26 10/27/2018  ? GLUCOSE 74 10/27/2018  ? BUN 12 10/27/2018  ? CREATININE 0.59 10/27/2018  ? BILITOT 0.5 10/27/2018  ? ALKPHOS 45 10/27/2018  ? AST 23 10/27/2018  ? ALT 22 10/27/2018  ? PROT 7.3 10/27/2018  ? ALBUMIN 4.6 10/27/2018  ? CALCIUM 9.4 10/27/2018  ? GFR 121.01 10/27/2018  ? ?Lab Results  ?Component Value Date  ? CHOL 180 10/27/2018  ? ?Lab Results  ?Component Value Date  ? HDL 78.90 10/27/2018  ? ?Lab Results  ?Component Value Date  ? LDLCALC 90 10/27/2018  ? ?Lab Results  ?Component Value Date  ? TRIG 57.0 10/27/2018  ? ?Lab Results  ?Component Value Date  ? CHOLHDL 2 10/27/2018  ? ?No results found for: HGBA1C ? ?   ?Assessment & Plan:  ? ?Problem List Items Addressed This Visit   ? ?  ? Unprioritized  ? ADD (attention deficit disorder) - Primary  ? Relevant Medications  ? amphetamine-dextroamphetamine (ADDERALL) 10 MG tablet  ? High risk medication use  ? Relevant Orders  ? Drug Monitoring Panel [id number] , Urine  ? ? ? ?Meds ordered this encounter  ?Medications  ? amphetamine-dextroamphetamine (ADDERALL) 10 MG tablet  ?  Sig: TAKE 1 TABLET BY MOUTH EVERY MORNING AND 2 TABLETS EVERY EVENING  ?  Dispense:  90 tablet  ?  Refill:  0  ? ? ?I, Donato Schultz, DO, personally preformed the services described in this documentation.  All medical record entries made by the scribe were at my direction and in my presence.  I have reviewed the chart and discharge instructions (if applicable) and agree that the record reflects  my personal performance and is accurate and complete. 04/15/2022 ? ? ?Engineer, structural as a Neurosurgeon for [employer], DO.,have documented all relevant documentation on the behalf of Donato Schultz,

## 2022-04-15 NOTE — Patient Instructions (Signed)
Living With Attention Deficit Hyperactivity Disorder If you have been diagnosed with attention deficit hyperactivity disorder (ADHD), you may be relieved that you now know why you have felt or behaved a certain way. Still, you may feel overwhelmed about the treatment ahead. You may also wonder how to get the support you need and how to deal with the condition day-to-day. With treatment and support, you can live with ADHD and manage your symptoms. How to manage lifestyle changes Managing stress Stress is your body's reaction to life changes and events, both good and bad. To cope with the stress of an ADHD diagnosis, it may help to: Learn more about ADHD. Exercise regularly. Even a short daily walk can lower stress levels. Participate in training or education programs (including social skills training classes) that teach you to deal with symptoms.  Medicines Your health care provider may suggest certain medicines if he or she feels that they will help to improve your condition. Stimulant medicines are usually prescribed to treat ADHD, and therapy may also be prescribed. It is important to: Avoid using alcohol and other substances that may prevent your medicines from working properly (may interact). Talk with your pharmacist or health care provider about all the medicines that you take, their possible side effects, and what medicines are safe to take together. Make it your goal to take part in all treatment decisions (shared decision-making). Ask about possible side effects of medicines that your health care provider recommends, and tell him or her how you feel about having those side effects. It is best if shared decision-making with your health care provider is part of your total treatment plan. Relationships To strengthen your relationships with family members while treating your condition, consider taking part in family therapy. You might also attend self-help groups alone or with a loved one. Be  honest about how your symptoms affect your relationships. Make an effort to communicate respectfully instead of fighting, and find ways to show others that you care. Psychotherapy may be useful in helping you cope with how ADHD affects your relationships. How to recognize changes in your condition The following signs may mean that your treatment is working well and your condition is improving: Consistently being on time for appointments. Being more organized at home and work. Other people noticing improvements in your behavior. Achieving goals that you set for yourself. Thinking more clearly. The following signs may mean that your treatment is not working very well: Feeling impatience or more confusion. Missing, forgetting, or being late for appointments. An increasing sense of disorganization and messiness. More difficulty in reaching goals that you set for yourself. Loved ones becoming angry or frustrated with you. Follow these instructions at home: Take over-the-counter and prescription medicines only as told by your health care provider. Check with your health care provider before taking any new medicines. Create structure and an organized atmosphere at home. For example: Make a list of tasks, then rank them from most important to least important. Work on one task at a time until your listed tasks are done. Make a daily schedule and follow it consistently every day. Use an appointment calendar, and check it 2 or 3 times a day to keep on track. Keep it with you when you leave the house. Create spaces where you keep certain things, and always put things back in their places after you use them. Keep all follow-up visits as told by your health care provider. This is important. Where to find support Talking to others    Keep emotion out of important discussions and speak in a calm, logical way. Listen closely and patiently to your loved ones. Try to understand their point of view, and try to  avoid getting defensive. Take responsibility for the consequences of your actions. Ask that others do not take your behaviors personally. Aim to solve problems as they come up, and express your feelings instead of bottling them up. Talk openly about what you need from your loved ones and how they can support you. Consider going to family therapy sessions or having your family meet with a specialist who deals with ADHD-related behavior problems. Finances Not all insurance plans cover mental health care, so it is important to check with your insurance carrier. If paying for co-pays or counseling services is a problem, search for a local or county mental health care center. Public mental health care services may be offered there at a low cost or no cost when you are not able to see a private health care provider. If you are taking medicine for ADHD, you may be able to get the generic form, which may be less expensive than brand-name medicine. Some makers of prescription medicines also offer help to patients who cannot afford the medicines that they need. Questions to ask your health care provider: What are the risks and benefits of taking medicines? Would I benefit from therapy? How often should I follow up with a health care provider? Contact a health care provider if: You have side effects from your medicines, such as: Repeated muscle twitches, coughing, or speech outbursts. Sleep problems. Loss of appetite. Depression. New or worsening behavior problems. Dizziness. Unusually fast heartbeat. Stomach pains. Headaches. Get help right away if: You have a severe reaction to a medicine. Your behavior suddenly gets worse. Summary With treatment and support, you can live with ADHD and manage your symptoms. The medicines that are most often prescribed for ADHD are stimulants. Consider taking part in family therapy or self-help groups with family members or friends. When you talk with friends  and family about your ADHD, be patient and communicate openly. Take over-the-counter and prescription medicines only as told by your health care provider. Check with your health care provider before taking any new medicines. This information is not intended to replace advice given to you by your health care provider. Make sure you discuss any questions you have with your health care provider. Document Revised: 04/18/2020 Document Reviewed: 05/21/2020 Elsevier Patient Education  2023 [employer]vier Inc[employer]  

## 2022-04-17 LAB — DRUG MONITORING PANEL 376104, URINE
Amphetamine: 4914 ng/mL — ABNORMAL HIGH (ref <250)
Amphetamines: POSITIVE ng/mL — AB (ref <500)
Barbiturates: NEGATIVE ng/mL (ref <300)
Benzodiazepines: NEGATIVE ng/mL (ref <100)
Cocaine Metabolite: NEGATIVE ng/mL (ref <150)
Desmethyltramadol: NEGATIVE ng/mL (ref <100)
Methamphetamine: NEGATIVE ng/mL (ref <250)
Opiates: NEGATIVE ng/mL (ref <100)
Oxycodone: NEGATIVE ng/mL (ref <100)
Tramadol: NEGATIVE ng/mL (ref <100)

## 2022-04-17 LAB — DM TEMPLATE

## 2022-04-22 ENCOUNTER — Encounter: Payer: Self-pay | Admitting: Family Medicine

## 2022-05-14 ENCOUNTER — Ambulatory Visit: Payer: BC Managed Care – PPO | Admitting: Family Medicine

## 2022-05-26 ENCOUNTER — Other Ambulatory Visit: Payer: Self-pay | Admitting: Family Medicine

## 2022-05-26 DIAGNOSIS — F988 Other specified behavioral and emotional disorders with onset usually occurring in childhood and adolescence: Secondary | ICD-10-CM

## 2022-05-26 MED ORDER — AMPHETAMINE-DEXTROAMPHETAMINE 10 MG PO TABS: ORAL_TABLET | ORAL | 0 refills | Status: DC

## 2022-05-26 NOTE — Telephone Encounter (Signed)
Requesting: Adderall 10mg  Contract:  04/15/22 UDS: 04/15/22  Last Visit: 04/15/22 Next Visit: none Last Refill: 04/15/22  Please Advise

## 2022-05-31 DIAGNOSIS — D2261 Melanocytic nevi of right upper limb, including shoulder: Secondary | ICD-10-CM | POA: Diagnosis not present

## 2022-05-31 DIAGNOSIS — D225 Melanocytic nevi of trunk: Secondary | ICD-10-CM | POA: Diagnosis not present

## 2022-05-31 DIAGNOSIS — L821 Other seborrheic keratosis: Secondary | ICD-10-CM | POA: Diagnosis not present

## 2022-05-31 DIAGNOSIS — D2262 Melanocytic nevi of left upper limb, including shoulder: Secondary | ICD-10-CM | POA: Diagnosis not present

## 2022-06-28 ENCOUNTER — Other Ambulatory Visit: Payer: Self-pay | Admitting: Family Medicine

## 2022-06-28 DIAGNOSIS — R251 Tremor, unspecified: Secondary | ICD-10-CM

## 2022-06-28 DIAGNOSIS — F988 Other specified behavioral and emotional disorders with onset usually occurring in childhood and adolescence: Secondary | ICD-10-CM

## 2022-06-28 MED ORDER — ALPRAZOLAM 0.25 MG PO TABS: 0.2500 mg | ORAL_TABLET | Freq: Three times a day (TID) | ORAL | 0 refills | Status: DC | PRN

## 2022-06-28 MED ORDER — AMPHETAMINE-DEXTROAMPHETAMINE 10 MG PO TABS: ORAL_TABLET | ORAL | 0 refills | Status: DC

## 2022-06-28 NOTE — Telephone Encounter (Signed)
Requesting: adderall 10mg  and alprazolam 0.25mg  Contract: 04/15/22 UDS: 04/15/22 Last Visit: 04/15/22 Next Visit: none Last Refill:  adderall 05/26/22 and alprazolam 12/09/21  Please Advise

## 2022-07-22 ENCOUNTER — Other Ambulatory Visit: Payer: Self-pay | Admitting: Family Medicine

## 2022-07-22 DIAGNOSIS — R251 Tremor, unspecified: Secondary | ICD-10-CM

## 2022-07-22 MED ORDER — ALPRAZOLAM 0.25 MG PO TABS: 0.2500 mg | ORAL_TABLET | Freq: Three times a day (TID) | ORAL | 0 refills | Status: DC | PRN

## 2022-07-22 NOTE — Telephone Encounter (Signed)
Requesting:xanax 0.25 mg Contract:04/15/22 UDS:04/15/22 Last Visit:04/15/22 Next Visit:unknown Last Refill:06/28/22  Please Advise

## 2022-08-24 ENCOUNTER — Other Ambulatory Visit: Payer: Self-pay | Admitting: Family Medicine

## 2022-08-24 DIAGNOSIS — F988 Other specified behavioral and emotional disorders with onset usually occurring in childhood and adolescence: Secondary | ICD-10-CM

## 2022-08-25 MED ORDER — AMPHETAMINE-DEXTROAMPHETAMINE 10 MG PO TABS: ORAL_TABLET | ORAL | 0 refills | Status: DC

## 2022-08-25 NOTE — Telephone Encounter (Signed)
Patient is requesting a refill of the following medications: Requested Prescriptions   Pending Prescriptions Disp Refills   amphetamine-dextroamphetamine (ADDERALL) 10 MG tablet 90 tablet 0    Sig: TAKE 1 TABLET BY MOUTH EVERY MORNING AND 2 TABLETS EVERY EVENING    Last Visit: 04/15/22 Next Visit: none Last Refill: 06/28/22 UDS/Contract: 04/15/22  Please Advise

## 2022-09-21 ENCOUNTER — Other Ambulatory Visit: Payer: Self-pay | Admitting: Family Medicine

## 2022-09-21 DIAGNOSIS — F988 Other specified behavioral and emotional disorders with onset usually occurring in childhood and adolescence: Secondary | ICD-10-CM

## 2022-09-21 MED ORDER — AMPHETAMINE-DEXTROAMPHETAMINE 10 MG PO TABS: ORAL_TABLET | ORAL | 0 refills | Status: DC

## 2022-09-21 NOTE — Telephone Encounter (Signed)
Requesting: Adderall 10mg   Contract:04/15/22 UDS:04/15/22 Last Visit: 04/15/22 Next Visit: None Last Refill: 08/25/22 #90 and 0RF   Please Advise

## 2022-10-06 ENCOUNTER — Encounter: Payer: Self-pay | Admitting: Nurse Practitioner

## 2022-10-06 ENCOUNTER — Ambulatory Visit (INDEPENDENT_AMBULATORY_CARE_PROVIDER_SITE_OTHER): Payer: BC Managed Care – PPO | Admitting: Nurse Practitioner

## 2022-10-06 VITALS — BP 132/82 | HR 89 | Ht 65.5 in | Wt 158.0 lb

## 2022-10-06 DIAGNOSIS — Z01419 Encounter for gynecological examination (general) (routine) without abnormal findings: Secondary | ICD-10-CM

## 2022-10-06 NOTE — Progress Notes (Signed)
   Destiny Rangel Dec 25, 1980 [id number]   History:  41 y.o. G1P0010 presents for annual exam without GYN complaints. Monthly cycles. 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 Miscarriage in January 2023. Still having a hard time emotionally.   Gynecologic History Patient's last menstrual period was 09/15/2022 (exact date). Period Cycle (Days):  (28-30) Period Duration (Days): 4 Menstrual Flow: Moderate Menstrual Control: Tampon Dysmenorrhea: (!) Mild Dysmenorrhea Symptoms: Cramping Contraception: none Sexually active: Yes  Health Maintenance Last Pap: 10/01/2020. Results were: Normal, 3-year repeat Last mammogram: 04/13/2022. Results were: Normal Last colonoscopy: Not indicated Last Dexa: Not indicated  Past medical history, past surgical history, family history and social history were all reviewed and documented in the EPIC chart. Married. Works for [employer]. Got Mal-shi puppy.  ROS:  A ROS was performed and pertinent positives and negatives are included.  Exam:  Vitals:   10/06/22 0942  BP: 132/82  Pulse: 89  SpO2: 99%  Weight: 158 lb (71.7 kg)  Height: 5' 5.5" (1.664 m)     Body mass index is 25.89 kg/m.  General appearance:  Normal Thyroid:  Symmetrical, normal in size, without palpable masses or nodularity. Respiratory  Auscultation:  Clear without wheezing or rhonchi Cardiovascular  Auscultation:  Regular rate, without rubs, murmurs or gallops  Edema/varicosities:  Not grossly evident Abdominal  Soft,nontender, without masses, guarding or rebound.  Liver/spleen:  No organomegaly noted  Hernia:  None appreciated  Skin  Inspection:  Grossly normal   Breasts: Examined lying and sitting.   Right: Without masses, retractions, discharge or axillary adenopathy.   Left: Without masses, retractions, discharge or axillary adenopathy. Genitourinary   Inguinal/mons:  Normal without inguinal adenopathy  External genitalia:  Normal appearing  vulva with no masses, tenderness, or lesions  BUS/Urethra/Skene's glands:  Normal  Vagina:  Normal appearing with normal color and discharge, no lesions  Cervix:  Normal appearing without discharge or lesions  Uterus:  Normal in size, shape and contour.  Midline and mobile, nontender  Adnexa/parametria:     Rt: Normal in size, without masses or tenderness.   Lt: Normal in size, without masses or tenderness.  Anus and perineum: Normal  Digital rectal exam: Not indicated  Patient informed chaperone available to be present for breast and pelvic exam. Patient has requested no chaperone to be present. Patient has been advised what will be completed during breast and pelvic exam.    Assessment/Plan:  41 y.o. G1P0010 for annual exam.   Well female exam with routine gynecological exam - Education provided on SBEs, importance of preventative screenings, current guidelines, high calcium diet, regular exercise, and multivitamin daily. Labs through work.   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 Will repeat at 3-year interval per guidelines.  Screening for breast cancer - Normal mammogram history.  Continue annual screenings.  Normal breast exam today.  Screening for colon cancer - Average risk. Will start screenings at age 30.   Follow up in 1 year for annual.       Tamela Gammon [facility], 10:04 AM 10/06/2022

## 2022-10-21 ENCOUNTER — Other Ambulatory Visit: Payer: Self-pay | Admitting: Family Medicine

## 2022-10-21 DIAGNOSIS — F988 Other specified behavioral and emotional disorders with onset usually occurring in childhood and adolescence: Secondary | ICD-10-CM

## 2022-10-21 MED ORDER — AMPHETAMINE-DEXTROAMPHETAMINE 10 MG PO TABS: ORAL_TABLET | ORAL | 0 refills | Status: DC

## 2022-10-21 NOTE — Telephone Encounter (Signed)
Requesting: Adderall 10mg   Contract: 04/15/22 UDS: 04/15/22 Last Visit: 04/15/22 Next Visit: None Last Refill: 09/21/22 #90 and 0RF  Please Advise

## 2022-11-24 ENCOUNTER — Other Ambulatory Visit: Payer: Self-pay | Admitting: Family Medicine

## 2022-11-24 DIAGNOSIS — R251 Tremor, unspecified: Secondary | ICD-10-CM

## 2022-11-24 DIAGNOSIS — F988 Other specified behavioral and emotional disorders with onset usually occurring in childhood and adolescence: Secondary | ICD-10-CM

## 2022-11-24 NOTE — Telephone Encounter (Signed)
Requesting: Adderall 10mg  and alprazolam 0.25mg   Contract:04/15/22 UDS:04/15/22 Last Visit:04/15/22 Next Visit: None Last Refill on Adderall: 10/21/22 #90 and 0RF  Last Refill on alprazolam: 07/22/22 #30 and 0RF   Please Advise

## 2022-11-25 MED ORDER — ALPRAZOLAM 0.25 MG PO TABS: 0.2500 mg | ORAL_TABLET | Freq: Three times a day (TID) | ORAL | 0 refills | Status: DC | PRN

## 2022-11-25 MED ORDER — AMPHETAMINE-DEXTROAMPHETAMINE 10 MG PO TABS: ORAL_TABLET | ORAL | 0 refills | Status: DC

## 2022-12-03 ENCOUNTER — Other Ambulatory Visit: Payer: Self-pay | Admitting: Family Medicine

## 2022-12-03 ENCOUNTER — Encounter: Payer: Self-pay | Admitting: Family Medicine

## 2022-12-03 MED ORDER — AMPHETAMINE-DEXTROAMPHETAMINE 20 MG PO TABS: 20.0000 mg | ORAL_TABLET | Freq: Two times a day (BID) | ORAL | 0 refills | Status: DC

## 2023-01-03 ENCOUNTER — Other Ambulatory Visit: Payer: Self-pay | Admitting: Family Medicine

## 2023-01-03 MED ORDER — AMPHETAMINE-DEXTROAMPHETAMINE 20 MG PO TABS
20.0000 mg | ORAL_TABLET | Freq: Two times a day (BID) | ORAL | 0 refills | Status: DC
Start: 2023-01-03 — End: 2023-02-01

## 2023-01-03 NOTE — Telephone Encounter (Signed)
Requesting: Adderall  Contract: 04/15/2022 UDS: 04/15/2022 Last Visit: 04/15/2022 Next Visit: N/A Last Refill: 12/03/2022  Please Advise

## 2023-02-01 ENCOUNTER — Other Ambulatory Visit: Payer: Self-pay | Admitting: Family Medicine

## 2023-02-01 DIAGNOSIS — F988 Other specified behavioral and emotional disorders with onset usually occurring in childhood and adolescence: Secondary | ICD-10-CM

## 2023-02-01 MED ORDER — AMPHETAMINE-DEXTROAMPHETAMINE 20 MG PO TABS: 20.0000 mg | ORAL_TABLET | Freq: Two times a day (BID) | ORAL | 0 refills | Status: DC

## 2023-02-01 NOTE — Telephone Encounter (Signed)
Requesting: Adderall 25m Contract: 04/15/22 UDS: 04/15/22 Last Visit: 04/15/22 Next Visit: None Last Refill: 01/03/23 #60 and 0RF   Message sent to Pt to make her aware she is overdue for appt.   Please Advise

## 2023-02-28 ENCOUNTER — Ambulatory Visit: Payer: BC Managed Care – PPO | Admitting: Family Medicine

## 2023-02-28 ENCOUNTER — Encounter: Payer: Self-pay | Admitting: Family Medicine

## 2023-02-28 VITALS — BP 100/80 | HR 101 | Temp 98.7°F | Resp 18 | Ht 65.5 in | Wt 149.8 lb

## 2023-02-28 DIAGNOSIS — Z79899 Other long term (current) drug therapy: Secondary | ICD-10-CM

## 2023-02-28 DIAGNOSIS — R251 Tremor, unspecified: Secondary | ICD-10-CM

## 2023-02-28 DIAGNOSIS — F988 Other specified behavioral and emotional disorders with onset usually occurring in childhood and adolescence: Secondary | ICD-10-CM

## 2023-02-28 DIAGNOSIS — L309 Dermatitis, unspecified: Secondary | ICD-10-CM

## 2023-02-28 MED ORDER — ALPRAZOLAM 0.25 MG PO TABS: 0.2500 mg | ORAL_TABLET | Freq: Three times a day (TID) | ORAL | 0 refills | Status: DC | PRN

## 2023-02-28 MED ORDER — CLOTRIMAZOLE-BETAMETHASONE 1-0.05 % EX CREA: 1.0000 | TOPICAL_CREAM | Freq: Two times a day (BID) | CUTANEOUS | 0 refills | Status: DC

## 2023-02-28 MED ORDER — AMPHETAMINE-DEXTROAMPHETAMINE 20 MG PO TABS: 20.0000 mg | ORAL_TABLET | Freq: Two times a day (BID) | ORAL | 0 refills | Status: DC

## 2023-02-28 NOTE — Progress Notes (Signed)
Subjective:   By signing my name below, I, Jamey Reas, attest that this documentation has been prepared under the direction and in the presence of Ann Held, DO. 02/28/2023   Patient ID: Destiny Rangel, female    DOB: [birth year], 42 y.o.   MRN: [id number]  Chief Complaint  Patient presents with   ADD   Follow-up   Rash    2 weeks, right side only, pt states having occ itching     HPI Patient is in today for an office visit to f/u on ADD.  Pt states the med is still working well and she needs a refill.   Rash She complain of a itchy rash on the left side of her neck. She uses Cortizone cream to manage it. She has a history of eczema.   Blood pressure Her blood pressure is stable during this visit. Her pulse is slightly elevated during this visit.  BP Readings from Last 3 Encounters:  02/28/23 100/80  10/06/22 132/82  04/15/22 100/70    Pulse Readings from Last 3 Encounters:  02/28/23 (!) 101  10/06/22 89  04/15/22 92    Pap smear Last pap smear completed on 10/01/2020. Results are normal.  Xanax She takes 0.25 mg xanax as needed to manage her anxiety and reports taking it around 15 times per month.     Past Medical History:  Diagnosis Date   ADHD (attention deficit hyperactivity disorder)    Allergy     Past Surgical History:  Procedure Laterality Date   CERVICAL CONIZATION W/BX N/A 12/02/2015   Procedure: CONIZATION CERVIX WITH BIOPSY;  Surgeon: Terrance Mass, MD;  Location: [facility];  Service: Gynecology;  Laterality: N/A;   DILATION AND CURETTAGE OF UTERUS  12/2020   WISDOM TOOTH EXTRACTION      Family History  Problem Relation Age of Onset   Parkinsonism Maternal Grandmother        dementia   Cancer Maternal Grandfather    Alcohol abuse Other    Depression Other    Cancer Mother        skin   Cancer Father        skin    Social History   Socioeconomic History   Marital status: Married    Spouse name: Not on file   Number of  children: Not on file   Years of education: Not on file   Highest education level: Not on file  Occupational History   Not on file  Tobacco Use   Smoking status: Former    Types: Cigarettes    Quit date: 2022    Years since quitting: 2.1   Smokeless tobacco: Never  Substance and Sexual Activity   Alcohol use: Yes    Rangel: socially   Drug use: No   Sexual activity: Yes    Partners: Male    Birth control/protection: Condom    Rangel: INTERCOURSE AGE 78, MORE THAN 5  Other Topics Concern   Not on file  Social History Narrative   Not on file   Social Determinants of Health   Financial Resource Strain: Not on file  Food Insecurity: Not on file  Transportation Needs: Not on file  Physical Activity: Not on file  Stress: Not on file  Social Connections: Not on file  Intimate Partner Violence: Not on file    Outpatient Medications Prior to Visit  Medication Sig Dispense Refill   Azelaic Acid 15 % cream After skin is thoroughly washed  and patted dry, gently but thoroughly massage a thin film of azelaic acid cream into the affected area twice daily, in the morning and evening. 30 g 0   betamethasone valerate ointment (VALISONE) 0.1 % Apply 1 application topically 2 (two) times daily. 30 g 0   fluticasone (FLONASE) 50 MCG/ACT nasal spray USE 2 SPRAYS IN EACH NOSTRIL DAILY 48 g 3   Levocetirizine Dihydrochloride (XYZAL PO) Take by mouth.     ALPRAZolam (XANAX) 0.25 MG tablet Take 1 tablet (0.25 mg total) by mouth 3 (three) times daily as needed. 30 tablet 0   amphetamine-dextroamphetamine (ADDERALL) 20 MG tablet Take 1 tablet (20 mg total) by mouth 2 (two) times daily. 60 tablet 0   No facility-administered medications prior to visit.    No Known Allergies  Review of Systems  Constitutional:  Negative for fever and malaise/fatigue.  HENT:  Negative for congestion.   Eyes:  Negative for blurred vision.  Respiratory:  Negative for cough and shortness of breath.    Cardiovascular:  Negative for chest pain, palpitations and leg swelling.  Gastrointestinal:  Negative for vomiting.  Musculoskeletal:  Negative for back pain.  Skin:  Positive for itching (on left side of neck) and rash (on left side of neck).  Neurological:  Negative for loss of consciousness and headaches.       Objective:    Physical Exam Vitals and nursing note reviewed.  Constitutional:      General: She is not in acute distress. HENT:     Head: Normocephalic and atraumatic.     Right Ear: External ear normal.     Left Ear: External ear normal.  Eyes:     Extraocular Movements: Extraocular movements intact.     Pupils: Pupils are equal, round, and reactive to light.  Cardiovascular:     Rate and Rhythm: Normal rate and regular rhythm.     Heart sounds: Normal heart sounds. No murmur heard.    No gallop.  Pulmonary:     Effort: Pulmonary effort is normal. No respiratory distress.     Breath sounds: Normal breath sounds. No wheezing or rales.  Skin:    General: Skin is warm.  Neurological:     Mental Status: She is alert and oriented to person, place, and time.  Psychiatric:        Judgment: Judgment normal.     BP 100/80 (BP Location: Left Arm, Patient Position: Sitting, Cuff Size: Normal)   Pulse (!) 101   Temp 98.7 F (37.1 C) (Oral)   Resp 18   Ht 5' 5.5" (1.664 m)   Wt 149 lb 12.8 oz (67.9 kg)   SpO2 99%   BMI 24.55 kg/m  Wt Readings from Last 3 Encounters:  02/28/23 149 lb 12.8 oz (67.9 kg)  10/06/22 158 lb (71.7 kg)  04/15/22 163 lb 12.8 oz (74.3 kg)       Assessment & Plan:  Attention deficit disorder (ADD) without hyperactivity -     Amphetamine-Dextroamphetamine; Take 1 tablet (20 mg total) by mouth 2 (two) times daily.  Dispense: 60 tablet; Refill: 0  Tremor -     ALPRAZolam; Take 1 tablet (0.25 mg total) by mouth 3 (three) times daily as needed.  Dispense: 30 tablet; Refill: 0  High risk medication use -     Drug Monitoring Panel [phone] ,  Urine  Eczema, unspecified type Assessment & Plan: Lotrisone bid   Orders: -     Clotrimazole-Betamethasone; Apply 1 Application topically 2 (  two) times daily.  Dispense: 30 g; Refill: 0    I, Ann Held, DO, personally preformed the services described in this documentation.  All medical record entries made by the scribe were at my direction and in my presence.  I have reviewed the chart and discharge instructions (if applicable) and agree that the record reflects my personal performance and is accurate and complete. 02/28/2023  Reeves as a scribe for Ann Held, DO.,have documented all relevant documentation on the behalf of Ann Held, DO,as directed by  Ann Held, DO while in the presence of Ann Held, DO.

## 2023-02-28 NOTE — Assessment & Plan Note (Signed)
Lotrisone bid

## 2023-02-28 NOTE — Assessment & Plan Note (Signed)
Stable Con't adderall

## 2023-02-28 NOTE — Patient Instructions (Signed)
Living With Attention Deficit Hyperactivity Disorder If you have been diagnosed with attention deficit hyperactivity disorder (ADHD), you may be relieved that you now know why you have felt or behaved a certain way. Still, you may feel overwhelmed about the treatment ahead. You may also wonder how to get the support you need and how to deal with the condition day-to-day. With treatment and support, you can live with ADHD and manage your symptoms. How to manage lifestyle changes Managing lifestyle changes can be challenging. Seeking support from your healthcare provider, therapist, family, and friends can be helpful. How to recognize changes in your condition The following signs may mean that your treatment is working well and your condition is improving: Consistently being on time for appointments. Being more organized at home and work. Other people noticing improvements in your behavior. Achieving goals that you set for yourself. Thinking more clearly. The following signs may mean that your treatment is not working very well: Feeling impatience or more confusion. Missing, forgetting, or being late for appointments. An increasing sense of disorganization and messiness. More difficulty in reaching goals that you set for yourself. Loved ones becoming angry or frustrated with you. Follow these instructions at home: Medicines Take over-the-counter and prescription medicines only as told by your health care provider. Check with your health care provider before taking any new medicines. General instructions Create structure and an organized atmosphere at home. For example: Make a list of tasks, then rank them from most important to least important. Work on one task at a time until your listed tasks are done. Make a daily schedule and follow it consistently every day. Use an appointment calendar, and check it 2-3 times a day to keep on track. Keep it with you when you leave the house. Create  spaces where you keep certain things, and always put things back in their places after you use them. Keep all follow-up visits. Your health care provider will need to monitor your condition and adjust your treatment over time. Where to find support Talking to others  Keep emotion out of important discussions and speak in a calm, logical way. Listen closely and patiently to your loved ones. Try to understand their point of view, and try to avoid getting defensive. Take responsibility for the consequences of your actions. Ask that others do not take your behaviors personally. Aim to solve problems as they come up, and express your feelings instead of bottling them up. Talk openly about what you need from your loved ones and how they can support you. Consider going to family therapy sessions or having your family meet with a specialist who deals with ADHD-related behavior problems. Finances Not all insurance plans cover mental health care, so it is important to check with your insurance carrier. If paying for co-pays or counseling services is a problem, search for a local or county mental health care center. Public mental health care services may be offered there at a low cost or no cost when you are not able to see a private health care provider. If you are taking medicine for ADHD, you may be able to get the generic form, which may be less expensive than brand-name medicine. Some makers of prescription medicines also offer help to patients who cannot afford the medicines that they need. Therapy and support groups Talking with a mental health care provider and participating in support groups can help to improve your quality of life, daily functioning, and overall symptoms. Questions to ask your health   care provider: What are the risks and benefits of taking medicines? Would I benefit from therapy? How often should I follow up with a health care provider? Where to find more information Learn more  about ADHD from: Children and Adults with Attention Deficit Hyperactivity Disorder: chadd.org National Institute of Mental Health: nimh.nih.gov Centers for Disease Control and Prevention: cdc.gov Contact a health care provider if: You have side effects from your medicines, such as: Repeated muscle twitches, coughing, or speech outbursts. Sleep problems. Loss of appetite. Dizziness. Unusually fast heartbeat. Stomach pains. Headaches. You have new or worsening behavior problems. You are struggling with anxiety, depression, or substance abuse. Get help right away if: You have a severe reaction to a medicine. These symptoms may be an emergency. Get help right away. Call 911. Do not wait to see if the symptoms will go away. Do not drive yourself to the hospital. Take one of these steps if you feel like you may hurt yourself or others, or have thoughts about taking your own life: Go to your nearest emergency room. Call 911. Call the National Suicide Prevention Lifeline at 1-800-273-8255 or 988. This is open 24 hours a day. Text the Crisis Text Line at 741741. Summary With treatment and support, you can live with ADHD and manage your symptoms. Consider taking part in family therapy or self-help groups with family members or friends. When you talk with friends and family about your ADHD, be patient and communicate openly. Keep all follow-up visits. Your health care provider will need to monitor your condition and adjust your treatment over time. This information is not intended to replace advice given to you by your health care provider. Make sure you discuss any questions you have with your health care provider. Document Revised: 03/26/2022 Document Reviewed: 03/26/2022 Elsevier Patient Education  2023 Elsevier Inc.  

## 2023-03-02 LAB — DRUG MONITORING PANEL 376104, URINE
Amphetamine: 1593 ng/mL — ABNORMAL HIGH (ref <250)
Amphetamines: POSITIVE ng/mL — AB (ref <500)
Barbiturates: NEGATIVE ng/mL (ref <300)
Benzodiazepines: NEGATIVE ng/mL (ref <100)
Cocaine Metabolite: NEGATIVE ng/mL (ref <150)
Desmethyltramadol: NEGATIVE ng/mL (ref <100)
Methamphetamine: NEGATIVE ng/mL (ref <250)
Opiates: NEGATIVE ng/mL (ref <100)
Oxycodone: NEGATIVE ng/mL (ref <100)
Tramadol: NEGATIVE ng/mL (ref <100)

## 2023-03-02 LAB — DM TEMPLATE

## 2023-03-31 ENCOUNTER — Other Ambulatory Visit: Payer: Self-pay | Admitting: Family Medicine

## 2023-03-31 DIAGNOSIS — F988 Other specified behavioral and emotional disorders with onset usually occurring in childhood and adolescence: Secondary | ICD-10-CM

## 2023-03-31 MED ORDER — AMPHETAMINE-DEXTROAMPHETAMINE 20 MG PO TABS: 20.0000 mg | ORAL_TABLET | Freq: Two times a day (BID) | ORAL | 0 refills | Status: DC

## 2023-03-31 NOTE — Telephone Encounter (Signed)
Requesting: Adderall 20mg   Contract: 02/28/23 UDS: 02/28/23 Last Visit: 02/28/23 Next Visit: None Last Refill: 02/28/23 #60 and 0RF   Please Advise

## 2023-04-04 ENCOUNTER — Encounter: Payer: Self-pay | Admitting: *Deleted

## 2023-04-06 ENCOUNTER — Encounter: Payer: Self-pay | Admitting: Family Medicine

## 2023-04-29 ENCOUNTER — Other Ambulatory Visit: Payer: Self-pay | Admitting: Family Medicine

## 2023-04-29 DIAGNOSIS — F988 Other specified behavioral and emotional disorders with onset usually occurring in childhood and adolescence: Secondary | ICD-10-CM

## 2023-04-29 MED ORDER — AMPHETAMINE-DEXTROAMPHETAMINE 20 MG PO TABS: 20.0000 mg | ORAL_TABLET | Freq: Two times a day (BID) | ORAL | 0 refills | Status: DC

## 2023-04-29 NOTE — Telephone Encounter (Signed)
Requesting: Adderall 20mg   Contract: 02/28/23 UDS: 02/28/23 Last Visit: 02/28/23 Next Visit: None Last Refill: 03/31/23 #60 and 0RF   Please Advise

## 2023-05-30 ENCOUNTER — Other Ambulatory Visit: Payer: Self-pay | Admitting: Family Medicine

## 2023-05-30 DIAGNOSIS — F988 Other specified behavioral and emotional disorders with onset usually occurring in childhood and adolescence: Secondary | ICD-10-CM

## 2023-05-30 DIAGNOSIS — R251 Tremor, unspecified: Secondary | ICD-10-CM

## 2023-05-30 MED ORDER — ALPRAZOLAM 0.25 MG PO TABS: 0.2500 mg | ORAL_TABLET | Freq: Three times a day (TID) | ORAL | 0 refills | Status: DC | PRN

## 2023-05-30 MED ORDER — AMPHETAMINE-DEXTROAMPHETAMINE 20 MG PO TABS: 20.0000 mg | ORAL_TABLET | Freq: Two times a day (BID) | ORAL | 0 refills | Status: DC

## 2023-05-30 NOTE — Telephone Encounter (Signed)
Requesting: Alprazolam 0.25 and Adderall 20 mg Contract: 04/15/2022 UDS:02/28/2023 Last Visit: 02/28/2023 Next Visit: NA Last Refill: 02/28/2023 Alprazolam 0.25 mg #30 0 refills 04/29/2023 Adderall 20 mg #60 0 refills  Please Advise

## 2023-06-13 ENCOUNTER — Encounter: Payer: Self-pay | Admitting: Nurse Practitioner

## 2023-06-22 ENCOUNTER — Other Ambulatory Visit: Payer: Self-pay | Admitting: Nurse Practitioner

## 2023-06-22 DIAGNOSIS — Z30011 Encounter for initial prescription of contraceptive pills: Secondary | ICD-10-CM

## 2023-06-22 MED ORDER — NORETHINDRONE 0.35 MG PO TABS
1.0000 | ORAL_TABLET | Freq: Every day | ORAL | 1 refills | Status: DC
Start: 2023-06-22 — End: 2023-09-06

## 2023-06-22 NOTE — Telephone Encounter (Signed)
Last AEX 10/06/2022--scheduled for 10/10/2023.

## 2023-06-30 ENCOUNTER — Encounter: Payer: Self-pay | Admitting: Family Medicine

## 2023-06-30 DIAGNOSIS — F988 Other specified behavioral and emotional disorders with onset usually occurring in childhood and adolescence: Secondary | ICD-10-CM

## 2023-06-30 NOTE — Telephone Encounter (Signed)
Requesting: Adderall 20mg   Contract: 02/28/23 UDS: 02/28/23 Last Visit: 02/28/23 Next Visit: None Last Refill: 05/30/23 #60 and 0RF   Please Advise

## 2023-07-01 ENCOUNTER — Other Ambulatory Visit: Payer: Self-pay | Admitting: Family Medicine

## 2023-07-01 DIAGNOSIS — F988 Other specified behavioral and emotional disorders with onset usually occurring in childhood and adolescence: Secondary | ICD-10-CM

## 2023-07-01 MED ORDER — AMPHETAMINE-DEXTROAMPHETAMINE 20 MG PO TABS: 20.0000 mg | ORAL_TABLET | Freq: Two times a day (BID) | ORAL | 0 refills | Status: DC

## 2023-08-01 ENCOUNTER — Other Ambulatory Visit: Payer: Self-pay | Admitting: Family Medicine

## 2023-08-01 DIAGNOSIS — F988 Other specified behavioral and emotional disorders with onset usually occurring in childhood and adolescence: Secondary | ICD-10-CM

## 2023-08-02 MED ORDER — AMPHETAMINE-DEXTROAMPHETAMINE 20 MG PO TABS
20.0000 mg | ORAL_TABLET | Freq: Two times a day (BID) | ORAL | 0 refills | Status: DC
Start: 2023-08-02 — End: 2023-09-02

## 2023-08-02 NOTE — Telephone Encounter (Signed)
Requesting: Adderall 20 mg Sig: Take 1 tablet (20 mg total) by mouth 2 (two) times daily.  Contract: 02/28/2023 UDS: 02/28/2023 Last Visit: 02/28/2023 Next Visit: 09/02/2023 Last Refill: 07/01/2023  #60 0 refills   Please Advise

## 2023-08-03 DIAGNOSIS — D2261 Melanocytic nevi of right upper limb, including shoulder: Secondary | ICD-10-CM | POA: Diagnosis not present

## 2023-08-03 DIAGNOSIS — D225 Melanocytic nevi of trunk: Secondary | ICD-10-CM | POA: Diagnosis not present

## 2023-08-03 DIAGNOSIS — L814 Other melanin hyperpigmentation: Secondary | ICD-10-CM | POA: Diagnosis not present

## 2023-08-03 DIAGNOSIS — D224 Melanocytic nevi of scalp and neck: Secondary | ICD-10-CM | POA: Diagnosis not present

## 2023-08-04 ENCOUNTER — Encounter (INDEPENDENT_AMBULATORY_CARE_PROVIDER_SITE_OTHER): Payer: Self-pay

## 2023-09-02 ENCOUNTER — Ambulatory Visit (INDEPENDENT_AMBULATORY_CARE_PROVIDER_SITE_OTHER): Payer: BC Managed Care – PPO | Admitting: Family Medicine

## 2023-09-02 ENCOUNTER — Encounter: Payer: Self-pay | Admitting: Family Medicine

## 2023-09-02 VITALS — BP 112/80 | HR 103 | Temp 98.8°F | Resp 18 | Ht 65.5 in | Wt 144.6 lb

## 2023-09-02 DIAGNOSIS — F988 Other specified behavioral and emotional disorders with onset usually occurring in childhood and adolescence: Secondary | ICD-10-CM

## 2023-09-02 DIAGNOSIS — Z23 Encounter for immunization: Secondary | ICD-10-CM

## 2023-09-02 DIAGNOSIS — R251 Tremor, unspecified: Secondary | ICD-10-CM

## 2023-09-02 DIAGNOSIS — Z0000 Encounter for general adult medical examination without abnormal findings: Secondary | ICD-10-CM | POA: Diagnosis not present

## 2023-09-02 LAB — LIPID PANEL
Cholesterol: 181 mg/dL (ref 0–200)
HDL: 91.9 mg/dL (ref >39.00)
LDL Cholesterol: 82 mg/dL (ref 0–99)
NonHDL: 89.05
Total CHOL/HDL Ratio: 2
Triglycerides: 34 mg/dL (ref 0.0–149.0)
VLDL: 6.8 mg/dL (ref 0.0–40.0)

## 2023-09-02 LAB — COMPREHENSIVE METABOLIC PANEL
ALT: 28 U/L (ref 0–35)
AST: 29 U/L (ref 0–37)
Albumin: 4.5 g/dL (ref 3.5–5.2)
Alkaline Phosphatase: 41 U/L (ref 39–117)
BUN: 10 mg/dL (ref 6–23)
CO2: 25 meq/L (ref 19–32)
Calcium: 9.1 mg/dL (ref 8.4–10.5)
Chloride: 102 meq/L (ref 96–112)
Creatinine, Ser: 0.58 mg/dL (ref 0.40–1.20)
GFR: 111 mL/min (ref >60.00)
Glucose, Bld: 73 mg/dL (ref 70–99)
Potassium: 3.8 meq/L (ref 3.5–5.1)
Sodium: 136 meq/L (ref 135–145)
Total Bilirubin: 0.7 mg/dL (ref 0.2–1.2)
Total Protein: 7.3 g/dL (ref 6.0–8.3)

## 2023-09-02 LAB — TSH: TSH: 2.05 u[IU]/mL (ref 0.35–5.50)

## 2023-09-02 LAB — CBC WITH DIFFERENTIAL/PLATELET
Basophils Absolute: 0.1 10*3/uL (ref 0.0–0.1)
Basophils Relative: 0.8 % (ref 0.0–3.0)
Eosinophils Absolute: 0 10*3/uL (ref 0.0–0.7)
Eosinophils Relative: 0.4 % (ref 0.0–5.0)
HCT: 41.1 % (ref 36.0–46.0)
Hemoglobin: 13.3 g/dL (ref 12.0–15.0)
Lymphocytes Relative: 31.6 % (ref 12.0–46.0)
Lymphs Abs: 2.3 10*3/uL (ref 0.7–4.0)
MCHC: 32.5 g/dL (ref 30.0–36.0)
MCV: 96.2 fl (ref 78.0–100.0)
Monocytes Absolute: 0.7 10*3/uL (ref 0.1–1.0)
Monocytes Relative: 9.3 % (ref 3.0–12.0)
Neutro Abs: 4.1 10*3/uL (ref 1.4–7.7)
Neutrophils Relative %: 57.9 % (ref 43.0–77.0)
Platelets: 331 10*3/uL (ref 150.0–400.0)
RBC: 4.28 Mil/uL (ref 3.87–5.11)
RDW: 13.2 % (ref 11.5–15.5)
WBC: 7.1 10*3/uL (ref 4.0–10.5)

## 2023-09-02 MED ORDER — ALPRAZOLAM 0.25 MG PO TABS: 0.2500 mg | ORAL_TABLET | Freq: Three times a day (TID) | ORAL | 0 refills | Status: DC | PRN

## 2023-09-02 MED ORDER — AMPHETAMINE-DEXTROAMPHETAMINE 20 MG PO TABS: 20.0000 mg | ORAL_TABLET | Freq: Two times a day (BID) | ORAL | 0 refills | Status: DC

## 2023-09-02 NOTE — Assessment & Plan Note (Signed)
Con't adderall  F/u 6 months

## 2023-09-02 NOTE — Progress Notes (Signed)
Established Patient Office Visit  Subjective   Patient ID: Destiny Rangel, female    DOB: [birth year]  Age: 42 y.o. MRN: [id number]  Chief Complaint  Patient presents with   Annual Exam    Pt states not fasting     HPI Discussed the use of AI scribe software for clinical note transcription with the patient, who gave verbal consent to proceed.  History of Present Illness   The patient presents for an annual physical. She reports no new surgeries or changes in family history. She has an upcoming gynecology appointment and recently had a mammogram at [facility]. She has a history of eczema and has been seen by a dermatologist. She is due for a dental visit and is considering finding a new dentist closer to her new residence in [place]. She is currently taking Adderall 20mg  twice daily and Xanax as needed, both of which she requested refills for. She also mentioned that she had some blood work done at work where her cholesterol was found to be on the high side.       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   CIN III (cervical intraepithelial neoplasia III) 10/24/2015   Abnormal cervical Pap smear with positive HPV DNA test 10/17/2015   Tremor 03/29/2013   Acne vulgaris 03/18/2010   TOBACCO ABUSE 08/16/2008   CARPAL TUNNEL SYNDROME, BILATERAL 01/29/2008   ADD (attention deficit disorder) 04/27/2007   ALLERGIC RHINITIS 04/27/2007   Eczema 04/27/2007   Past Medical History:  Diagnosis Date   ADHD (attention deficit hyperactivity disorder)    Allergy    Past Surgical History:  Procedure Laterality Date   CERVICAL CONIZATION W/BX N/A 12/02/2015   Procedure: CONIZATION CERVIX WITH BIOPSY;  Surgeon: Ok Edwards, MD;  Location: [facility];  Service:  Gynecology;  Laterality: N/A;   DILATION AND CURETTAGE OF UTERUS  12/2020   WISDOM TOOTH EXTRACTION     Social History   Tobacco Use   Smoking status: Former    Current packs/day: 0.00    Types: Cigarettes    Quit date: 2022    Years since quitting: 2.7   Smokeless tobacco: Never  Substance Use Topics   Alcohol use: Yes    Comment: socially   Drug use: No   Social History   Socioeconomic History   Marital status: Married    Spouse name: Not on file   Number of children: Not on file   Years of education: Not on file   Highest education level: Not on file  Occupational History   Not on file  Tobacco Use   Smoking status: Former    Current packs/day: 0.00    Types: Cigarettes    Quit date: 2022    Years since quitting: 2.7   Smokeless tobacco: Never  Substance and Sexual Activity   Alcohol use: Yes    Comment: socially   Drug use: No   Sexual activity: Yes    Partners: Male    Birth control/protection: Condom    Comment: INTERCOURSE AGE 15, MORE THAN 5  Other Topics Concern   Not on file  Social History Narrative   Not on file   Social Determinants of Health   Financial Resource Strain: Not on file  Food Insecurity: Not on  file  Transportation Needs: Not on file  Physical Activity: Not on file  Stress: Not on file  Social Connections: Unknown (04/21/2022)   Received from [facility]   Social Network    Social Network: Not on file  Intimate Partner Violence: Unknown (03/22/2022)   Received from [facility], [facility]   HITS    Physically Hurt: Not on file    Insult or Talk Down To: Not on file    Threaten Physical Harm: Not on file    Scream or Curse: Not on file   Family Status  Relation Name Status   MGM  Deceased   MGF  Deceased   Other  (Not Specified)   Mother  (Not Specified)   Father  (Not Specified)  No partnership data on file   Family History  Problem Relation Age of Onset   Parkinsonism Maternal Grandmother         dementia   Cancer Maternal Grandfather    Alcohol abuse Other    Depression Other    Cancer Mother        skin   Cancer Father        skin   No Known Allergies    Review of Systems  Constitutional:  Negative for chills, fever and malaise/fatigue.  HENT:  Negative for congestion and hearing loss.   Eyes:  Negative for blurred vision and discharge.  Respiratory:  Negative for cough, sputum production and shortness of breath.   Cardiovascular:  Negative for chest pain, palpitations and leg swelling.  Gastrointestinal:  Negative for abdominal pain, blood in stool, constipation, diarrhea, heartburn, nausea and vomiting.  Genitourinary:  Negative for dysuria, frequency, hematuria and urgency.  Musculoskeletal:  Negative for back pain, falls and myalgias.  Skin:  Negative for rash.  Neurological:  Negative for dizziness, sensory change, loss of consciousness, weakness and headaches.  Endo/Heme/Allergies:  Negative for environmental allergies. Does not bruise/bleed easily.  Psychiatric/Behavioral:  Negative for depression and suicidal ideas. The patient is not nervous/anxious and does not have insomnia.       Objective:     BP 112/80 (BP Location: Left Arm, Patient Position: Sitting, Cuff Size: Normal)   Pulse (!) 103   Temp 98.8 F (37.1 C) (Oral)   Resp 18   Ht 5' 5.5" (1.664 m)   Wt 144 lb 9.6 oz (65.6 kg)   LMP 07/11/2023   SpO2 99%   BMI 23.70 kg/m  BP Readings from Last 3 Encounters:  09/02/23 112/80  02/28/23 100/80  10/06/22 132/82   Wt Readings from Last 3 Encounters:  09/02/23 144 lb 9.6 oz (65.6 kg)  02/28/23 149 lb 12.8 oz (67.9 kg)  10/06/22 158 lb (71.7 kg)   SpO2 Readings from Last 3 Encounters:  09/02/23 99%  02/28/23 99%  10/06/22 99%      Physical Exam Vitals and nursing note reviewed.  Constitutional:      General: She is not in acute distress.    Appearance: Normal appearance. She is well-developed.  HENT:     Head: Normocephalic and  atraumatic.     Right Ear: Tympanic membrane, ear canal and external ear normal. There is no impacted cerumen.     Left Ear: Tympanic membrane, ear canal and external ear normal. There is no impacted cerumen.     Nose: Nose normal.     Mouth/Throat:     Mouth: Mucous membranes are moist.     Pharynx: Oropharynx is clear. No oropharyngeal  exudate or posterior oropharyngeal erythema.  Eyes:     General: No scleral icterus.       Right eye: No discharge.        Left eye: No discharge.     Conjunctiva/sclera: Conjunctivae normal.     Pupils: Pupils are equal, round, and reactive to light.  Neck:     Thyroid: No thyromegaly or thyroid tenderness.     Vascular: No JVD.  Cardiovascular:     Rate and Rhythm: Normal rate and regular rhythm.     Heart sounds: Normal heart sounds. No murmur heard. Pulmonary:     Effort: Pulmonary effort is normal. No respiratory distress.     Breath sounds: Normal breath sounds.  Abdominal:     General: Bowel sounds are normal. There is no distension.     Palpations: Abdomen is soft. There is no mass.     Tenderness: There is no abdominal tenderness. There is no guarding or rebound.  Genitourinary:    Vagina: Normal.  Musculoskeletal:        General: Normal range of motion.     Cervical back: Normal range of motion and neck supple.     Right lower leg: No edema.     Left lower leg: No edema.  Lymphadenopathy:     Cervical: No cervical adenopathy.  Skin:    General: Skin is warm and dry.     Findings: No erythema or rash.  Neurological:     Mental Status: She is alert and oriented to person, place, and time.     Cranial Nerves: No cranial nerve deficit.     Deep Tendon Reflexes: Reflexes are normal and symmetric.  Psychiatric:        Mood and Affect: Mood normal.        Behavior: Behavior normal.        Thought Content: Thought content normal.        Judgment: Judgment normal.      No results found for any visits on 09/02/23.  Last CBC Lab  Results  Component Value Date   WBC 7.9 10/27/2018   HGB 13.9 10/27/2018   HCT 40.8 10/27/2018   MCV 94.7 10/27/2018   MCH 31.7 12/02/2015   RDW 13.4 10/27/2018   PLT 282.0 10/27/2018   Last metabolic panel Lab Results  Component Value Date   GLUCOSE 74 10/27/2018   NA 135 10/27/2018   K 4.2 10/27/2018   CL 102 10/27/2018   CO2 26 10/27/2018   BUN 12 10/27/2018   CREATININE 0.59 10/27/2018   GFR 121.01 10/27/2018   CALCIUM 9.4 10/27/2018   PROT 7.3 10/27/2018   ALBUMIN 4.6 10/27/2018   BILITOT 0.5 10/27/2018   ALKPHOS 45 10/27/2018   AST 23 10/27/2018   ALT 22 10/27/2018   Last lipids Lab Results  Component Value Date   CHOL 180 10/27/2018   HDL 78.90 10/27/2018   LDLCALC 90 10/27/2018   TRIG 57.0 10/27/2018   CHOLHDL 2 10/27/2018   Last hemoglobin A1c No results found for: "HGBA1C" Last thyroid functions Lab Results  Component Value Date   TSH 3.15 10/27/2018   Last vitamin D No results found for: "25OHVITD2", "25OHVITD3", "VD25OH" Last vitamin B12 and Folate No results found for: "VITAMINB12", "FOLATE"    The ASCVD Risk score (Arnett DK, et al., 2019) failed to calculate for the following reasons:   Cannot find a previous HDL lab   Cannot find a previous total cholesterol lab    Assessment &  Plan:   Problem List Items Addressed This Visit       Unprioritized   Tremor   Relevant Medications   ALPRAZolam (XANAX) 0.25 MG tablet   Preventative health care - Primary    Ghm utd Check labs  See AVS Health Maintenance  Topic Date Due   COVID-19 Vaccine (3 - Moderna risk series) 05/09/2020   MAMMOGRAM  04/14/2023   INFLUENZA VACCINE  07/21/2023   HPV VACCINES (2 - 3-dose SCDM series) 09/01/2024 (Originally 12/01/2007)   DTaP/Tdap/Td (2 - Td or Tdap) 05/21/2024   PAP SMEAR-Modifier  10/06/2025   Hepatitis C Screening  Completed   HIV Screening  Completed         Relevant Orders   CBC with Differential/Platelet   Comprehensive metabolic  panel   Lipid panel   TSH   Attention deficit disorder (ADD) without hyperactivity    Con't adderall  F/u 6 months      Relevant Medications   amphetamine-dextroamphetamine (ADDERALL) 20 MG tablet   Other Visit Diagnoses     Need for influenza vaccination       Relevant Orders   Flu vaccine trivalent PF, 6mos and older(Flulaval,Afluria,Fluarix,Fluzone)      Assessment and Plan    ADHD Stable on Adderall 20mg  twice daily. -Refill Adderall 20mg  twice daily.  Anxiety Stable on Xanax. -Refill Xanax as needed.  Hyperlipidemia Recent non-fasting cholesterol test was high. -Check fasting lipid panel today.  General Health Maintenance -Flu vaccine administered today. -Schedule mammogram. -Continue regular dental visits. -Continue regular dermatology visits.       Return in about 6 months (around 03/01/2024), or if symptoms worsen or fail to improve.    Donato Schultz, DO

## 2023-09-02 NOTE — Patient Instructions (Signed)
Preventive Care 43-43 Years Old, Female  Preventive care refers to lifestyle choices and visits with your health care provider that can promote health and wellness. Preventive care visits are also called wellness exams.  What can I expect for my preventive care visit?  Counseling  Your health care provider may ask you questions about your:  Medical history, including:  Past medical problems.  Family medical history.  Pregnancy history.  Current health, including:  Menstrual cycle.  Method of birth control.  Emotional well-being.  Home life and relationship well-being.  Sexual activity and sexual health.  Lifestyle, including:  Alcohol, nicotine or tobacco, and drug use.  Access to firearms.  Diet, exercise, and sleep habits.  Work and work Astronomer.  Sunscreen use.  Safety issues such as seatbelt and bike helmet use.  Physical exam  Your health care provider will check your:  Height and weight. These may be used to calculate your BMI (body mass index). BMI is a measurement that tells if you are at a healthy weight.  Waist circumference. This measures the distance around your waistline. This measurement also tells if you are at a healthy weight and may help predict your risk of certain diseases, such as type 2 diabetes and high blood pressure.  Heart rate and blood pressure.  Body temperature.  Skin for abnormal spots.  What immunizations do I need?    Vaccines are usually given at various ages, according to a schedule. Your health care provider will recommend vaccines for you based on your age, medical history, and lifestyle or other factors, such as travel or where you work.  What tests do I need?  Screening  Your health care provider may recommend screening tests for certain conditions. This may include:  Lipid and cholesterol levels.  Diabetes screening. This is done by checking your blood sugar (glucose) after you have not eaten for a while (fasting).  Pelvic exam and Pap test.  Hepatitis B test.  Hepatitis C  test.  HIV (human immunodeficiency virus) test.  STI (sexually transmitted infection) testing, if you are at risk.  Lung cancer screening.  Colorectal cancer screening.  Mammogram. Talk with your health care provider about when you should start having regular mammograms. This may depend on whether you have a family history of breast cancer.  BRCA-related cancer screening. This may be done if you have a family history of breast, ovarian, tubal, or peritoneal cancers.  Bone density scan. This is done to screen for osteoporosis.  Talk with your health care provider about your test results, treatment options, and if necessary, the need for more tests.  Follow these instructions at home:  Eating and drinking    Eat a diet that includes fresh fruits and vegetables, whole grains, lean protein, and low-fat dairy products.  Take vitamin and mineral supplements as recommended by your health care provider.  Do not drink alcohol if:  Your health care provider tells you not to drink.  You are pregnant, may be pregnant, or are planning to become pregnant.  If you drink alcohol:  Limit how much you have to 0-1 drink a day.  Know how much alcohol is in your drink. In the [place] one drink equals one 12 oz bottle of beer (355 mL), one 5 oz glass of wine (148 mL), or one 1 oz glass of hard liquor (44 mL).  Lifestyle  Brush your teeth every morning and night with fluoride toothpaste. Floss one time each day.  Exercise for at least  30 minutes 5 or more days each week.  Do not use any products that contain nicotine or tobacco. These products include cigarettes, chewing tobacco, and vaping devices, such as e-cigarettes. If you need help quitting, ask your health care provider.  Do not use drugs.  If you are sexually active, practice safe sex. Use a condom or other form of protection to prevent STIs.  If you do not wish to become pregnant, use a form of birth control. If you plan to become pregnant, see your health care provider for a  prepregnancy visit.  Take aspirin only as told by your health care provider. Make sure that you understand how much to take and what form to take. Work with your health care provider to find out whether it is safe and beneficial for you to take aspirin daily.  Find healthy ways to manage stress, such as:  Meditation, yoga, or listening to music.  Journaling.  Talking to a trusted person.  Spending time with friends and family.  Minimize exposure to UV radiation to reduce your risk of skin cancer.  Safety  Always wear your seat belt while driving or riding in a vehicle.  Do not drive:  If you have been drinking alcohol. Do not ride with someone who has been drinking.  When you are tired or distracted.  While texting.  If you have been using any mind-altering substances or drugs.  Wear a helmet and other protective equipment during sports activities.  If you have firearms in your house, make sure you follow all gun safety procedures.  Seek help if you have been physically or sexually abused.  What's next?  Visit your health care provider once a year for an annual wellness visit.  Ask your health care provider how often you should have your eyes and teeth checked.  Stay up to date on all vaccines.  This information is not intended to replace advice given to you by your health care provider. Make sure you discuss any questions you have with your health care provider.  Document Revised: 06/03/2021 Document Reviewed: 06/03/2021  Elsevier Patient Education  2024 [employer].

## 2023-09-02 NOTE — Assessment & Plan Note (Signed)
Ghm utd Check labs  See AVS Health Maintenance  Topic Date Due   COVID-19 Vaccine (3 - Moderna risk series) 05/09/2020   MAMMOGRAM  04/14/2023   INFLUENZA VACCINE  07/21/2023   HPV VACCINES (2 - 3-dose SCDM series) 09/01/2024 (Originally 12/01/2007)   DTaP/Tdap/Td (2 - Td or Tdap) 05/21/2024   PAP SMEAR-Modifier  10/06/2025   Hepatitis C Screening  Completed   HIV Screening  Completed

## 2023-09-06 ENCOUNTER — Other Ambulatory Visit: Payer: Self-pay | Admitting: Nurse Practitioner

## 2023-09-06 DIAGNOSIS — Z30011 Encounter for initial prescription of contraceptive pills: Secondary | ICD-10-CM

## 2023-09-06 NOTE — Telephone Encounter (Signed)
Med refill request: [facility] Last AEX: 10/06/22 Next AEX: 10/10/23 Last MMG (if hormonal med) 04/13/22 Refill authorized: Please Advise, #84, 0 RF

## 2023-10-04 ENCOUNTER — Other Ambulatory Visit: Payer: Self-pay | Admitting: Family Medicine

## 2023-10-04 DIAGNOSIS — F988 Other specified behavioral and emotional disorders with onset usually occurring in childhood and adolescence: Secondary | ICD-10-CM

## 2023-10-04 NOTE — Telephone Encounter (Signed)
Requesting: Adderall 20mg   Contract: 02/28/23 UDS: 02/28/23 Last Visit: 09/02/23 Next Visit: None Last Refill: 09/02/23 #60 and 0RF   Please Advise

## 2023-10-05 MED ORDER — AMPHETAMINE-DEXTROAMPHETAMINE 20 MG PO TABS
20.0000 mg | ORAL_TABLET | Freq: Two times a day (BID) | ORAL | 0 refills | Status: DC
Start: 2023-10-05 — End: 2023-11-04

## 2023-10-10 ENCOUNTER — Encounter: Payer: Self-pay | Admitting: Nurse Practitioner

## 2023-10-10 ENCOUNTER — Other Ambulatory Visit (HOSPITAL_COMMUNITY)
Admission: RE | Admit: 2023-10-10 | Discharge: 2023-10-10 | Disposition: A | Discharge status: Home / Self Care | Payer: BC Managed Care – PPO | Source: Ambulatory Visit | Attending: Nurse Practitioner | Admitting: Nurse Practitioner

## 2023-10-10 ENCOUNTER — Ambulatory Visit (INDEPENDENT_AMBULATORY_CARE_PROVIDER_SITE_OTHER): Payer: BC Managed Care – PPO | Admitting: Nurse Practitioner

## 2023-10-10 VITALS — BP 120/82 | HR 105 | Ht 65.35 in | Wt 149.0 lb

## 2023-10-10 DIAGNOSIS — Z124 Encounter for screening for malignant neoplasm of cervix: Secondary | ICD-10-CM | POA: Diagnosis not present

## 2023-10-10 DIAGNOSIS — Z01419 Encounter for gynecological examination (general) (routine) without abnormal findings: Secondary | ICD-10-CM

## 2023-10-10 DIAGNOSIS — Z3041 Encounter for surveillance of contraceptive pills: Secondary | ICD-10-CM

## 2023-10-10 MED ORDER — NORETHINDRONE 0.35 MG PO TABS: 1.0000 | ORAL_TABLET | Freq: Every day | ORAL | 3 refills | Status: DC

## 2023-10-10 NOTE — Progress Notes (Signed)
Destiny Rangel 08-Aug-1981 [id number]   History:  42 y.o. G1P0010 presents for annual exam without GYN complaints.  Started mini pill in June for management of cramping and PMS symptoms. LMP in July but has had some spotting since. 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  Gynecologic History Patient's last menstrual period was 07/11/2023 (exact date). Period Duration (Days): 4 Menstrual Flow: Light Dysmenorrhea: None Contraception: oral progesterone-only contraceptive Sexually active: Yes  Health Maintenance Last Pap: 10/01/2020. Results were: Normal, 3-year repeat Last mammogram: 04/13/2022. Results were: Normal Last colonoscopy: Not indicated Last Dexa: Not indicated  Past medical history, past surgical history, family history and social history were all reviewed and documented in the EPIC chart. Married. Works for [employer].  ROS:  A ROS was performed and pertinent positives and negatives are included.  Exam:  Vitals:   10/10/23 1007  BP: 120/82  Pulse: (!) 105  SpO2: 99%  Weight: 149 lb (67.6 kg)  Height: 5' 5.35" (1.66 m)      Body mass index is 24.53 kg/m.  General appearance:  Normal Thyroid:  Symmetrical, normal in size, without palpable masses or nodularity. Respiratory  Auscultation:  Clear without wheezing or rhonchi Cardiovascular  Auscultation:  Regular rate, without rubs, murmurs or gallops  Edema/varicosities:  Not grossly evident Abdominal  Soft,nontender, without masses, guarding or rebound.  Liver/spleen:  No organomegaly noted  Hernia:  None appreciated  Skin  Inspection:  Grossly normal   Breasts: Examined lying and sitting.   Right: Without masses, retractions, discharge or axillary adenopathy.   Left: Without masses, retractions, discharge or axillary adenopathy. Pelvic: External genitalia:  no lesions              Urethra:  normal appearing urethra with no masses, tenderness or lesions              Bartholins  and Skenes: normal                 Vagina: normal appearing vagina with normal color and discharge, no lesions              Cervix: no lesions Bimanual Exam:  Uterus:  no masses or tenderness              Adnexa: no mass, fullness, tenderness              Rectovaginal: Deferred              Anus:  normal, no lesions  Patient informed chaperone available to be present for breast and pelvic exam. Patient has requested no chaperone to be present. Patient has been advised what will be completed during breast and pelvic exam.    Assessment/Plan:  42 y.o. G1P0010 for annual exam.   Well female exam with routine gynecological exam - Education provided on SBEs, importance of preventative screenings, current guidelines, high calcium diet, regular exercise, and multivitamin daily. Labs with PCP.   Cervical cancer screening - Plan: Cytology - PAP( [facility]). 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  Encounter for surveillance of contraceptive pills - Plan: norethindrone (MICRONOR) 0.35 MG tablet daily. Taking as prescribed. Reassurance that spotting is normal with POPs.   Screening for breast cancer - Normal mammogram history.  Continue annual screenings.  Normal breast exam today. Scheduled this week.   Screening for colon cancer - Average risk. Will start screenings at age 17.   Follow up in 1 year for annual.  Olivia Mackie [facility], 11:07 AM 10/10/2023

## 2023-10-13 DIAGNOSIS — Z1231 Encounter for screening mammogram for malignant neoplasm of breast: Secondary | ICD-10-CM | POA: Diagnosis not present

## 2023-10-13 DIAGNOSIS — R92323 Mammographic fibroglandular density, bilateral breasts: Secondary | ICD-10-CM | POA: Diagnosis not present

## 2023-10-13 LAB — HM MAMMOGRAPHY

## 2023-10-14 ENCOUNTER — Encounter: Payer: Self-pay | Admitting: Family Medicine

## 2023-10-17 LAB — CYTOLOGY - PAP
Comment: NEGATIVE
Diagnosis: UNDETERMINED — AB
High risk HPV: NEGATIVE

## 2023-11-04 ENCOUNTER — Other Ambulatory Visit: Payer: Self-pay | Admitting: Family Medicine

## 2023-11-04 DIAGNOSIS — F988 Other specified behavioral and emotional disorders with onset usually occurring in childhood and adolescence: Secondary | ICD-10-CM

## 2023-11-04 MED ORDER — AMPHETAMINE-DEXTROAMPHETAMINE 20 MG PO TABS
20.0000 mg | ORAL_TABLET | Freq: Two times a day (BID) | ORAL | 0 refills | Status: DC
Start: 2023-11-04 — End: 2023-12-05

## 2023-11-04 NOTE — Telephone Encounter (Signed)
Requesting: Adderall 20MG   Contract: 02/28/23 UDS: 02/28/23 Last Visit: 09/02/23 Next Visit: None Last Refill: 10/05/23 #60 and 0RF   Please Advise

## 2023-12-05 ENCOUNTER — Other Ambulatory Visit: Payer: Self-pay | Admitting: Family Medicine

## 2023-12-05 DIAGNOSIS — F988 Other specified behavioral and emotional disorders with onset usually occurring in childhood and adolescence: Secondary | ICD-10-CM

## 2023-12-05 DIAGNOSIS — R251 Tremor, unspecified: Secondary | ICD-10-CM

## 2023-12-05 MED ORDER — AMPHETAMINE-DEXTROAMPHETAMINE 20 MG PO TABS
20.0000 mg | ORAL_TABLET | Freq: Two times a day (BID) | ORAL | 0 refills | Status: DC
Start: 2023-12-05 — End: 2024-01-03

## 2023-12-05 MED ORDER — ALPRAZOLAM 0.25 MG PO TABS
0.2500 mg | ORAL_TABLET | Freq: Three times a day (TID) | ORAL | 0 refills | Status: DC | PRN
Start: 2023-12-05 — End: 2024-01-03

## 2023-12-05 NOTE — Telephone Encounter (Signed)
Requesting: Xanax 0.25 and Adderall 20 mg Contract: 02/28/2023 UDS: 02/28/2023 Last Visit: 09/02/2023 Next Visit: N/A Last Refill: Xanax 09/02/2023, Adderall 11/04/2023  Please Advise

## 2024-01-03 ENCOUNTER — Other Ambulatory Visit: Payer: Self-pay | Admitting: Family Medicine

## 2024-01-03 DIAGNOSIS — R251 Tremor, unspecified: Secondary | ICD-10-CM

## 2024-01-03 DIAGNOSIS — F988 Other specified behavioral and emotional disorders with onset usually occurring in childhood and adolescence: Secondary | ICD-10-CM

## 2024-01-04 MED ORDER — ALPRAZOLAM 0.25 MG PO TABS: 0.2500 mg | ORAL_TABLET | Freq: Three times a day (TID) | ORAL | 0 refills | Status: DC | PRN

## 2024-01-04 MED ORDER — AMPHETAMINE-DEXTROAMPHETAMINE 20 MG PO TABS: 20.0000 mg | ORAL_TABLET | Freq: Two times a day (BID) | ORAL | 0 refills | Status: DC

## 2024-01-04 NOTE — Telephone Encounter (Signed)
 Requesting: Xanax  0.25 mg and Adderall 20 mg  Contract: 02/28/2023 UDS: 02/28/2023 Last Visit: 09/02/2023 Next Visit: N/A Last Refill: 12/05/2023 for both   Please Advise

## 2024-02-03 ENCOUNTER — Other Ambulatory Visit: Payer: Self-pay | Admitting: Family Medicine

## 2024-02-03 DIAGNOSIS — F988 Other specified behavioral and emotional disorders with onset usually occurring in childhood and adolescence: Secondary | ICD-10-CM

## 2024-02-03 NOTE — Telephone Encounter (Signed)
Requesting: Adderall 20 mg  Contract: 02/28/2023 UDS: 02/28/2023 Last Visit: 09/02/2023 Next Visit: N/A Last Refill: 01/04/2024 #60 no refills   Please Advise

## 2024-02-04 MED ORDER — AMPHETAMINE-DEXTROAMPHETAMINE 20 MG PO TABS: 20.0000 mg | ORAL_TABLET | Freq: Two times a day (BID) | ORAL | 0 refills | Status: DC

## 2024-03-02 ENCOUNTER — Other Ambulatory Visit: Payer: Self-pay | Admitting: Family Medicine

## 2024-03-02 DIAGNOSIS — R251 Tremor, unspecified: Secondary | ICD-10-CM

## 2024-03-02 DIAGNOSIS — F988 Other specified behavioral and emotional disorders with onset usually occurring in childhood and adolescence: Secondary | ICD-10-CM

## 2024-03-02 MED ORDER — AMPHETAMINE-DEXTROAMPHETAMINE 20 MG PO TABS: 20.0000 mg | ORAL_TABLET | Freq: Two times a day (BID) | ORAL | 0 refills | Status: DC

## 2024-03-02 MED ORDER — ALPRAZOLAM 0.25 MG PO TABS: 0.2500 mg | ORAL_TABLET | Freq: Three times a day (TID) | ORAL | 0 refills | Status: DC | PRN

## 2024-03-02 NOTE — Telephone Encounter (Signed)
 Requesting: Adderall 20mg  and alprazolam 0.25mg  Contract: 02/28/23 UDS: 02/28/23 Last Visit: 09/02/23 Next Visit: None Last Refill: see med list   Please Advise

## 2024-03-05 ENCOUNTER — Other Ambulatory Visit: Payer: Self-pay

## 2024-03-05 DIAGNOSIS — Z3041 Encounter for surveillance of contraceptive pills: Secondary | ICD-10-CM

## 2024-03-05 MED ORDER — NORETHINDRONE 0.35 MG PO TABS: 1.0000 | ORAL_TABLET | Freq: Every day | ORAL | 2 refills | Status: DC

## 2024-03-05 NOTE — Telephone Encounter (Signed)
 Med refill request: Norethindrone   Last AEX: 10/10/23 Next AEX: 10/13/24 Last MMG (if hormonal med)10/13/23 - birads cat 2 benign Refill authorized: Last rx 10/10/23 #84 with 3 refills. Please approve or deny

## 2024-03-14 ENCOUNTER — Telehealth: Payer: Self-pay

## 2024-03-14 NOTE — Telephone Encounter (Signed)
 Received paper refill for norethindrone (Jencycla) Tab 28 0.35 mg 90-days supply with 3 refills to [facility] on 03/09/2024.   Last Rx sent #84 with 2 refills on 03/05/2024 to [facility] in [place].

## 2024-03-16 ENCOUNTER — Other Ambulatory Visit: Payer: Self-pay

## 2024-03-16 ENCOUNTER — Telehealth: Payer: Self-pay

## 2024-03-16 DIAGNOSIS — Z3041 Encounter for surveillance of contraceptive pills: Secondary | ICD-10-CM

## 2024-03-16 NOTE — Telephone Encounter (Signed)
 Pt LVM in triage line stating that she just filled the rx for a 35mo supply w/ local pharmacy but will desire the next fill to be done by the mail order. Thanks.

## 2024-03-16 NOTE — Telephone Encounter (Signed)
 Med refill request: norethindrone 0.35 mg Last AEX: 10/10/23 Next AEX: 10/10/24 Last MMG (if hormonal med) 10/13/23 BI-RADS 2 benign Patient left VM that she needs future refills sent to new mail order pharmacy [facility]. Refill authorized: norethindrone 0.35 mg Please approve or deny as appropriate.

## 2024-03-16 NOTE — Telephone Encounter (Signed)
 Fax request received from [facility] for Norethindrone 0.35mg .  RX was sent 03/05/24 to [facility] for #84 with 2 refills.  LM for patient to call back if she needed RX sent to new mail order pharmacy.  (Ok per DPR to [facility]).

## 2024-03-19 MED ORDER — NORETHINDRONE 0.35 MG PO TABS: 1.0000 | ORAL_TABLET | Freq: Every day | ORAL | 1 refills | Status: DC

## 2024-04-06 ENCOUNTER — Other Ambulatory Visit: Payer: Self-pay | Admitting: Family Medicine

## 2024-04-06 DIAGNOSIS — R251 Tremor, unspecified: Secondary | ICD-10-CM

## 2024-04-06 DIAGNOSIS — F988 Other specified behavioral and emotional disorders with onset usually occurring in childhood and adolescence: Secondary | ICD-10-CM

## 2024-04-09 MED ORDER — ALPRAZOLAM 0.25 MG PO TABS
0.2500 mg | ORAL_TABLET | Freq: Three times a day (TID) | ORAL | 0 refills | Status: DC | PRN
Start: 2024-04-09 — End: 2024-05-21

## 2024-04-09 MED ORDER — AMPHETAMINE-DEXTROAMPHETAMINE 20 MG PO TABS: 20.0000 mg | ORAL_TABLET | Freq: Two times a day (BID) | ORAL | 0 refills | Status: DC

## 2024-04-09 NOTE — Telephone Encounter (Signed)
 Requesting: alprazolam  0.25,  Adderall 20mg  Contract: No  need to update UDS: 02/28/23  need to update Last Visit: 09/02/2023 Next Visit: Visit date not found Last Refill: 03/02/24  Please Advise

## 2024-04-16 ENCOUNTER — Ambulatory Visit: Admitting: Family Medicine

## 2024-04-16 ENCOUNTER — Encounter: Payer: Self-pay | Admitting: Family Medicine

## 2024-04-16 VITALS — BP 130/90 | HR 103 | Temp 98.1°F | Resp 16 | Ht 65.35 in | Wt 145.0 lb

## 2024-04-16 DIAGNOSIS — Z79899 Other long term (current) drug therapy: Secondary | ICD-10-CM | POA: Diagnosis not present

## 2024-04-16 DIAGNOSIS — F988 Other specified behavioral and emotional disorders with onset usually occurring in childhood and adolescence: Secondary | ICD-10-CM

## 2024-04-16 DIAGNOSIS — J302 Other seasonal allergic rhinitis: Secondary | ICD-10-CM | POA: Insufficient documentation

## 2024-04-16 MED ORDER — AZELASTINE HCL 0.1 % NA SOLN: 1.0000 | Freq: Two times a day (BID) | NASAL | 12 refills | Status: AC

## 2024-04-16 NOTE — Assessment & Plan Note (Signed)
 Stable  On adderall  Uds/ contract updated Database reviewed

## 2024-04-16 NOTE — Assessment & Plan Note (Signed)
 Con't flonase  and xyzal  daily  Add astelin  Call or return to office as needed

## 2024-04-16 NOTE — Progress Notes (Signed)
 -                                               ``  Established Patient Office Visit  Subjective   Patient ID: Destiny Rangel, female    DOB: 07/15/1981  Age: 43 y.o. MRN: [id number]  Chief Complaint  Patient presents with   ADD   Follow-up    HPI Discussed the use of AI scribe software for clinical note transcription with the patient, who gave verbal consent to proceed.  History of Present Illness Destiny Rangel is a 43 year old female who presents with allergy symptoms and elevated blood pressure.  She has been experiencing allergy symptoms for the past couple of weeks, characterized by a sensation of her head being 'flooded with pain' and feeling swollen, though not visibly so. The symptoms have improved since last week, but she still experiences some clear nasal drainage. She has been using Flonase , Sudafed, Alka Seltzer, and Xyzal  to manage her symptoms.  Her blood pressure was noted to be elevated today, which she attributes to anxiety about being late and possibly due to taking Sudafed. She is currently taking Glumetza.  She mentions a past prescription for vitamin D, fifty thousand units weekly, which was recommended when her skin condition worsened.  She has a dog that she describes as her 'baby,' indicating a close bond and concern for the pet's well-being when she leaves for appointments.  No recent COVID test. No other symptoms aside from the pressure in her head.   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   CIN III (cervical intraepithelial neoplasia III) 10/24/2015   Abnormal cervical Pap smear with positive HPV DNA test 10/17/2015   Tremor 03/29/2013   Acne vulgaris 03/18/2010   TOBACCO ABUSE 08/16/2008   CARPAL TUNNEL SYNDROME,  BILATERAL 01/29/2008   ADD (attention deficit disorder) 04/27/2007   Allergic rhinitis 04/27/2007   Eczema 04/27/2007   Past Medical History:  Diagnosis Date   ADHD (attention deficit hyperactivity disorder)    Allergy    Past Surgical History:  Procedure Laterality Date   CERVICAL CONIZATION W/BX N/A 12/02/2015   Procedure: CONIZATION CERVIX WITH BIOPSY;  Surgeon: Davia Erps, MD;  Location: [facility];  Service: Gynecology;  Laterality: N/A;   DILATION AND CURETTAGE OF UTERUS  12/2020   WISDOM TOOTH EXTRACTION     Social History   Tobacco Use   Smoking status: Former    Current packs/day: 0.00    Types: Cigarettes    Quit date: 2022    Years since quitting: 3.3   Smokeless tobacco: Never  Substance Use Topics   Alcohol use: Yes    Comment: socially   Drug use: No   Social History   Socioeconomic History   Marital status: Married    Spouse name: Not on file   Number of children: Not on file   Years of education: Not on file   Highest education level: Master's degree (e.g., MA, MS, MEng, MEd, MSW, MBA)  Occupational History   Not on file  Tobacco Use   Smoking status: Former    Current packs/day: 0.00  Types: Cigarettes    Quit date: 2022    Years since quitting: 3.3   Smokeless tobacco: Never  Substance and Sexual Activity   Alcohol use: Yes    Comment: socially   Drug use: No   Sexual activity: Yes    Partners: Male    Birth control/protection: OCP    Comment: INTERCOURSE AGE 59, MORE THAN 5, BCP  Other Topics Concern   Not on file  Social History Narrative   Not on file   Social Drivers of Health   Financial Resource Strain: Low Risk  (04/15/2024)   Overall Financial Resource Strain (CARDIA)    Difficulty of Paying Living Expenses: Not very hard  Food Insecurity: No Food Insecurity (04/15/2024)   Hunger Vital Sign    Worried About Running Out of Food in the Last Year: Never true    Ran Out of Food in the Last Year: Never true  Transportation  Needs: No Transportation Needs (04/15/2024)   PRAPARE - Administrator, Civil Service (Medical): No    Lack of Transportation (Non-Medical): No  Physical Activity: Insufficiently Active (04/15/2024)   Exercise Vital Sign    Days of Exercise per Week: 4 days    Minutes of Exercise per Session: 30 min  Stress: Stress Concern Present (04/15/2024)   [employer] of Occupational Health - Occupational Stress Questionnaire    Feeling of Stress : To some extent  Social Connections: Moderately Integrated (04/15/2024)   Social Connection and Isolation Panel [NHANES]    Frequency of Communication with Friends and Family: More than three times a week    Frequency of Social Gatherings with Friends and Family: Twice a week    Attends Religious Services: 1 to 4 times per year    Active Member of [employer] or Organizations: No    Attends Engineer, structural: Not on file    Marital Status: Married  Intimate Partner Violence: Unknown (03/22/2022)   Received from [employer] [facility], [facility]   HITS    Physically Hurt: Not on file    Insult or Talk Down To: Not on file    Threaten Physical Harm: Not on file    Scream or Curse: Not on file   Family Status  Relation Name Status   MGM  Deceased   MGF  Deceased   Other  (Not Specified)   Mother  (Not Specified)   Father  (Not Specified)  No partnership data on file   Family History  Problem Relation Age of Onset   Parkinsonism Maternal Grandmother        dementia   Cancer Maternal Grandfather    Alcohol abuse Other    Depression Other    Cancer Mother        skin   Cancer Father        skin   No Known Allergies    Review of Systems  Constitutional:  Negative for chills, fever and malaise/fatigue.  HENT:  Positive for congestion and sinus pain. Negative for ear pain and hearing loss.   Eyes:  Negative for blurred vision and discharge.  Respiratory:  Negative for cough, sputum production and shortness of breath.    Cardiovascular:  Negative for chest pain, palpitations and leg swelling.  Gastrointestinal:  Negative for abdominal pain, blood in stool, constipation, diarrhea, heartburn, nausea and vomiting.  Genitourinary:  Negative for dysuria, frequency, hematuria and urgency.  Musculoskeletal:  Negative for back pain, falls and myalgias.  Skin:  Negative for  rash.  Neurological:  Negative for dizziness, sensory change, loss of consciousness, weakness and headaches.  Endo/Heme/Allergies:  Negative for environmental allergies. Does not bruise/bleed easily.  Psychiatric/Behavioral:  Negative for depression and suicidal ideas. The patient is not nervous/anxious and does not have insomnia.       Objective:     BP (!) 138/90 (BP Location: Left Arm, Patient Position: Sitting, Cuff Size: Large)   Pulse (!) 103   Temp 98.1 F (36.7 C) (Oral)   Resp 16   Ht 5' 5.35" (1.66 m)   Wt 145 lb (65.8 kg)   SpO2 99%   BMI 23.87 kg/m  BP Readings from Last 3 Encounters:  04/16/24 (!) 138/90  10/10/23 120/82  09/02/23 112/80   Wt Readings from Last 3 Encounters:  04/16/24 145 lb (65.8 kg)  10/10/23 149 lb (67.6 kg)  09/02/23 144 lb 9.6 oz (65.6 kg)   SpO2 Readings from Last 3 Encounters:  04/16/24 99%  10/10/23 99%  09/02/23 99%      Physical Exam Vitals and nursing note reviewed.  Constitutional:      General: She is not in acute distress.    Appearance: Normal appearance. She is well-developed.  HENT:     Head: Normocephalic and atraumatic.     Nose: Congestion and rhinorrhea present.     Mouth/Throat:     Pharynx: No oropharyngeal exudate or posterior oropharyngeal erythema.  Eyes:     General: No scleral icterus.       Right eye: No discharge.        Left eye: No discharge.  Cardiovascular:     Rate and Rhythm: Normal rate and regular rhythm.     Heart sounds: No murmur heard. Pulmonary:     Effort: Pulmonary effort is normal. No respiratory distress.     Breath sounds: Normal  breath sounds.  Abdominal:     General: Abdomen is flat.     Palpations: Abdomen is soft.     Tenderness: There is no abdominal tenderness.  Musculoskeletal:        General: Normal range of motion.     Cervical back: Normal range of motion and neck supple.     Right lower leg: No edema.     Left lower leg: No edema.  Skin:    General: Skin is warm and dry.  Neurological:     Mental Status: She is alert and oriented to person, place, and time.  Psychiatric:        Mood and Affect: Mood normal.        Behavior: Behavior normal.        Thought Content: Thought content normal.        Judgment: Judgment normal.      No results found for any visits on 04/16/24.  Last CBC Lab Results  Component Value Date   WBC 7.1 09/02/2023   HGB 13.3 09/02/2023   HCT 41.1 09/02/2023   MCV 96.2 09/02/2023   MCH 31.7 12/02/2015   RDW 13.2 09/02/2023   PLT 331.0 09/02/2023   Last metabolic panel Lab Results  Component Value Date   GLUCOSE 73 09/02/2023   NA 136 09/02/2023   K 3.8 09/02/2023   CL 102 09/02/2023   CO2 25 09/02/2023   BUN 10 09/02/2023   CREATININE 0.58 09/02/2023   GFR 111.00 09/02/2023   CALCIUM 9.1 09/02/2023   PROT 7.3 09/02/2023   ALBUMIN 4.5 09/02/2023   BILITOT 0.7 09/02/2023   ALKPHOS 41 09/02/2023  AST 29 09/02/2023   ALT 28 09/02/2023   Last lipids Lab Results  Component Value Date   CHOL 181 09/02/2023   HDL 91.90 09/02/2023   LDLCALC 82 09/02/2023   TRIG 34.0 09/02/2023   CHOLHDL 2 09/02/2023   Last hemoglobin A1c No results found for: "HGBA1C" Last thyroid functions Lab Results  Component Value Date   TSH 2.05 09/02/2023   Last vitamin D No results found for: "25OHVITD2", "25OHVITD3", "VD25OH" Last vitamin B12 and Folate No results found for: "VITAMINB12", "FOLATE"    The 10-year ASCVD risk score (Arnett DK, et al., 2019) is: 0.3%    Assessment & Plan:   Problem List Items Addressed This Visit       Unprioritized   Attention  deficit disorder (ADD) without hyperactivity - Primary   Other Visit Diagnoses       Seasonal allergies       Relevant Medications   azelastine (ASTELIN) 0.1 % nasal spray     Assessment and Plan Assessment & Plan Allergic rhinitis   Symptoms have persisted for several weeks, worsening last week with headaches, a sensation of swelling, and clear nasal discharge. Current treatment with Flonase , Sudafed, and Xyzal  has provided some relief, but symptoms continue. Prescribe Astelin nasal spray to use with Flonase . Send the prescription to [facility] for a trial before considering a three-month supply.  Elevated blood pressure   Blood pressure is elevated, likely due to recent use of decongestants like Sudafed and Alka Seltzer, which can increase blood pressure. Anxiety from being late may have also contributed. Advise discontinuation of decongestants that may elevate blood pressure. Reassess blood pressure after stopping decongestants.    Return in about 6 months (around 10/16/2024) for annual exam, fasting.    Hannan Tetzlaff R Lowne Chase, DO

## 2024-04-18 LAB — DRUG MONITORING PANEL 376104, URINE
Amphetamine: 1624 ng/mL — ABNORMAL HIGH (ref <250)
Amphetamines: POSITIVE ng/mL — AB (ref <500)
Barbiturates: NEGATIVE ng/mL (ref <300)
Benzodiazepines: NEGATIVE ng/mL (ref <100)
Cocaine Metabolite: NEGATIVE ng/mL (ref <150)
Desmethyltramadol: NEGATIVE ng/mL (ref <100)
Methamphetamine: NEGATIVE ng/mL (ref <250)
Opiates: NEGATIVE ng/mL (ref <100)
Oxycodone: NEGATIVE ng/mL (ref <100)
Tramadol: NEGATIVE ng/mL (ref <100)

## 2024-04-18 LAB — DM TEMPLATE

## 2024-04-21 ENCOUNTER — Encounter: Payer: Self-pay | Admitting: Family Medicine

## 2024-05-19 ENCOUNTER — Encounter: Payer: Self-pay | Admitting: Family Medicine

## 2024-05-21 ENCOUNTER — Other Ambulatory Visit: Payer: Self-pay | Admitting: Family Medicine

## 2024-05-21 DIAGNOSIS — R251 Tremor, unspecified: Secondary | ICD-10-CM

## 2024-05-21 DIAGNOSIS — F988 Other specified behavioral and emotional disorders with onset usually occurring in childhood and adolescence: Secondary | ICD-10-CM

## 2024-05-21 MED ORDER — AMPHETAMINE-DEXTROAMPHETAMINE 20 MG PO TABS: 20.0000 mg | ORAL_TABLET | Freq: Two times a day (BID) | ORAL | 0 refills | Status: DC

## 2024-05-21 MED ORDER — ALPRAZOLAM 0.25 MG PO TABS: 0.2500 mg | ORAL_TABLET | Freq: Three times a day (TID) | ORAL | 0 refills | Status: DC | PRN

## 2024-05-21 NOTE — Telephone Encounter (Signed)
 Requesting: Xanax  0.25mg , take 1 threes times daily prn, and Adderall 20mg , take 1 twice a day Contract: Yes UDS: 04/16/24 Last Visit: 04/16/2024 Next Visit: Visit date not found, due 10/16/24 for cpe Last Refill: 04/09/24, Alprazolam  #30 x no refill and Adderall #60 x no refill.  Please Advise

## 2024-06-19 ENCOUNTER — Encounter: Payer: Self-pay | Admitting: Family Medicine

## 2024-06-19 DIAGNOSIS — F988 Other specified behavioral and emotional disorders with onset usually occurring in childhood and adolescence: Secondary | ICD-10-CM

## 2024-06-19 MED ORDER — AMPHETAMINE-DEXTROAMPHETAMINE 20 MG PO TABS: 20.0000 mg | ORAL_TABLET | Freq: Two times a day (BID) | ORAL | 0 refills | Status: DC

## 2024-06-19 NOTE — Telephone Encounter (Signed)
 Requesting: Adderall  Contract: 04/16/24 UDS: 04/16/24  Last OV: 04/16/24 Next OV: n/a Last Refill: 05/21/24, #60--0 RF Database:   Please advise

## 2024-07-19 ENCOUNTER — Encounter: Payer: Self-pay | Admitting: Family Medicine

## 2024-07-19 DIAGNOSIS — F988 Other specified behavioral and emotional disorders with onset usually occurring in childhood and adolescence: Secondary | ICD-10-CM

## 2024-07-19 DIAGNOSIS — R251 Tremor, unspecified: Secondary | ICD-10-CM

## 2024-07-19 MED ORDER — AMPHETAMINE-DEXTROAMPHETAMINE 20 MG PO TABS: 20.0000 mg | ORAL_TABLET | Freq: Two times a day (BID) | ORAL | 0 refills | Status: DC

## 2024-07-19 MED ORDER — ALPRAZOLAM 0.25 MG PO TABS: 0.2500 mg | ORAL_TABLET | Freq: Three times a day (TID) | ORAL | 0 refills | Status: DC | PRN

## 2024-07-19 NOTE — Telephone Encounter (Signed)
 Requesting: Xanax  & Adderall Contract: 04/16/24 UDS: 04/16/24 Last OV:    Next OV: 09/03/2024 Last Refill: 05/21/2024, #30. & 06/19/24, #60 Database:   Please advise

## 2024-08-23 ENCOUNTER — Encounter: Payer: Self-pay | Admitting: Family Medicine

## 2024-08-23 DIAGNOSIS — R251 Tremor, unspecified: Secondary | ICD-10-CM

## 2024-08-23 DIAGNOSIS — F988 Other specified behavioral and emotional disorders with onset usually occurring in childhood and adolescence: Secondary | ICD-10-CM

## 2024-08-23 MED ORDER — ALPRAZOLAM 0.25 MG PO TABS: 0.2500 mg | ORAL_TABLET | Freq: Three times a day (TID) | ORAL | 0 refills | Status: DC | PRN

## 2024-08-23 MED ORDER — AMPHETAMINE-DEXTROAMPHETAMINE 20 MG PO TABS: 20.0000 mg | ORAL_TABLET | Freq: Two times a day (BID) | ORAL | 0 refills | Status: DC

## 2024-08-23 NOTE — Telephone Encounter (Signed)
 Requesting: Xanax  & Adderall Contract: 04/16/24 UDS:    Last OV:    Next OV: 09/03/2024 Last Refill: 07/19/2024 for both Database:   Please advise

## 2024-09-03 ENCOUNTER — Encounter: Payer: Self-pay | Admitting: Family Medicine

## 2024-09-03 ENCOUNTER — Ambulatory Visit: Admitting: Family Medicine

## 2024-09-03 VITALS — BP 120/88 | HR 121 | Temp 98.2°F | Resp 18 | Ht 65.35 in | Wt 146.0 lb

## 2024-09-03 DIAGNOSIS — Z0000 Encounter for general adult medical examination without abnormal findings: Secondary | ICD-10-CM

## 2024-09-03 DIAGNOSIS — Z23 Encounter for immunization: Secondary | ICD-10-CM | POA: Diagnosis not present

## 2024-09-03 LAB — COMPREHENSIVE METABOLIC PANEL WITH GFR
ALT: 10 U/L (ref 0–35)
AST: 18 U/L (ref 0–37)
Albumin: 4.7 g/dL (ref 3.5–5.2)
Alkaline Phosphatase: 41 U/L (ref 39–117)
BUN: 13 mg/dL (ref 6–23)
CO2: 26 meq/L (ref 19–32)
Calcium: 9.4 mg/dL (ref 8.4–10.5)
Chloride: 99 meq/L (ref 96–112)
Creatinine, Ser: 0.62 mg/dL (ref 0.40–1.20)
GFR: 108.46 mL/min (ref >60.00)
Glucose, Bld: 75 mg/dL (ref 70–99)
Potassium: 4.3 meq/L (ref 3.5–5.1)
Sodium: 133 meq/L — ABNORMAL LOW (ref 135–145)
Total Bilirubin: 0.5 mg/dL (ref 0.2–1.2)
Total Protein: 7.4 g/dL (ref 6.0–8.3)

## 2024-09-03 LAB — CBC WITH DIFFERENTIAL/PLATELET
Basophils Absolute: 0.1 K/uL (ref 0.0–0.1)
Basophils Relative: 0.8 % (ref 0.0–3.0)
Eosinophils Absolute: 0.1 K/uL (ref 0.0–0.7)
Eosinophils Relative: 0.8 % (ref 0.0–5.0)
HCT: 41.4 % (ref 36.0–46.0)
Hemoglobin: 13.8 g/dL (ref 12.0–15.0)
Lymphocytes Relative: 24.6 % (ref 12.0–46.0)
Lymphs Abs: 1.7 K/uL (ref 0.7–4.0)
MCHC: 33.4 g/dL (ref 30.0–36.0)
MCV: 93.7 fl (ref 78.0–100.0)
Monocytes Absolute: 0.6 K/uL (ref 0.1–1.0)
Monocytes Relative: 9.2 % (ref 3.0–12.0)
Neutro Abs: 4.4 K/uL (ref 1.4–7.7)
Neutrophils Relative %: 64.6 % (ref 43.0–77.0)
Platelets: 293 K/uL (ref 150.0–400.0)
RBC: 4.41 Mil/uL (ref 3.87–5.11)
RDW: 13.6 % (ref 11.5–15.5)
WBC: 6.8 K/uL (ref 4.0–10.5)

## 2024-09-03 LAB — LIPID PANEL
Cholesterol: 200 mg/dL (ref 0–200)
HDL: 81 mg/dL (ref >39.00)
LDL Cholesterol: 108 mg/dL — ABNORMAL HIGH (ref 0–99)
NonHDL: 119.19
Total CHOL/HDL Ratio: 2
Triglycerides: 57 mg/dL (ref 0.0–149.0)
VLDL: 11.4 mg/dL (ref 0.0–40.0)

## 2024-09-03 LAB — TSH: TSH: 2.18 u[IU]/mL (ref 0.35–5.50)

## 2024-09-03 NOTE — Progress Notes (Signed)
 Subjective:    Patient ID: Destiny Rangel, female    DOB: [birth year], 43 y.o.   MRN: [id number]  Chief Complaint  Patient presents with   Annual Exam    Pt states not fasting     HPI Patient is in today for cpe.  Discussed the use of AI scribe software for clinical note transcription with the patient, who gave verbal consent to proceed.  History of Present Illness Destiny Rangel is a 43 year old female who presents for an annual physical exam and evaluation of elevated blood pressure readings.  She has experienced elevated blood pressure on three recent occasions, with readings of 130/90 mmHg in April and 140/98 mmHg today. She also had a high reading at work, which she attributes to a potentially faulty machine. She is concerned about when to consider medication for her blood pressure. She took Adderall this morning, which can elevate blood pressure, and notes that her pulse is consistently high.  She engages in regular physical activity, including walking her dog and taking one-mile walks around her office area three to four days a week. She continues to work at [employer].  She reports occasional knee pain, which she notices some days, and notes that sitting for too long can make her knee feel stiff, and her hip can also feel stiff, though she feels these are unconnected. No new or concerning moles, and her dermatologist monitors a mole on her face every two years.  No stomach issues and her joints are doing okay. She has no concerns about moles, and the one on her face has not changed in size.    Past Medical History:  Diagnosis Date   ADHD (attention deficit hyperactivity disorder)    Allergy     Past Surgical History:  Procedure Laterality Date   CERVICAL CONIZATION W/BX N/A 12/02/2015   Procedure: CONIZATION CERVIX WITH BIOPSY;  Surgeon: Curlee VEAR Guan, MD;  Location: [facility];  Service: Gynecology;  Laterality: N/A;   DILATION AND CURETTAGE OF UTERUS  12/2020   WISDOM TOOTH  EXTRACTION      Family History  Problem Relation Age of Onset   Parkinsonism Maternal Grandmother        dementia   Cancer Maternal Grandfather    Alcohol abuse Other    Depression Other    Cancer Mother        skin   Cancer Father        skin    Social History   Socioeconomic History   Marital status: Married    Spouse name: Not on file   Number of children: Not on file   Years of education: Not on file   Highest education level: Master's degree (e.g., MA, MS, MEng, MEd, MSW, MBA)  Occupational History   Not on file  Tobacco Use   Smoking status: Former    Current packs/day: 0.00    Types: Cigarettes    Quit date: 2022    Years since quitting: 3.7   Smokeless tobacco: Never  Substance and Sexual Activity   Alcohol use: Yes    Comment: socially   Drug use: No   Sexual activity: Yes    Partners: Male    Birth control/protection: OCP    Comment: INTERCOURSE AGE 26, MORE THAN 5, BCP  Other Topics Concern   Not on file  Social History Narrative   Walking the dog    2 1 mile walks 3-4 days a week    Social Drivers of  Health   Financial Resource Strain: Low Risk  (09/02/2024)   Overall Financial Resource Strain (CARDIA)    Difficulty of Paying Living Expenses: Not very hard  Food Insecurity: No Food Insecurity (09/02/2024)   Hunger Vital Sign    Worried About Running Out of Food in the Last Year: Never true    Ran Out of Food in the Last Year: Never true  Transportation Needs: No Transportation Needs (09/02/2024)   PRAPARE - Administrator, Civil Service (Medical): No    Lack of Transportation (Non-Medical): No  Physical Activity: Insufficiently Active (09/02/2024)   Exercise Vital Sign    Days of Exercise per Week: 4 days    Minutes of Exercise per Session: 20 min  Stress: No Stress Concern Present (09/02/2024)   [employer] of Occupational Health - Occupational Stress Questionnaire    Feeling of Stress: Only a little  Social Connections:  Moderately Integrated (09/02/2024)   Social Connection and Isolation Panel    Frequency of Communication with Friends and Family: More than three times a week    Frequency of Social Gatherings with Friends and Family: Twice a week    Attends Religious Services: 1 to 4 times per year    Active Member of [employer] or Organizations: No    Attends Engineer, structural: Not on file    Marital Status: Married  Intimate Partner Violence: Unknown (03/22/2022)   Received from [facility]   HITS    Physically Hurt: Not on file    Insult or Talk Down To: Not on file    Threaten Physical Harm: Not on file    Scream or Curse: Not on file    Outpatient Medications Prior to Visit  Medication Sig Dispense Refill   ALPRAZolam  (XANAX ) 0.25 MG tablet Take 1 tablet (0.25 mg total) by mouth 3 (three) times daily as needed. 30 tablet 0   amphetamine -dextroamphetamine  (ADDERALL) 20 MG tablet Take 1 tablet (20 mg total) by mouth 2 (two) times daily. 60 tablet 0   Azelaic Acid  15 % cream After skin is thoroughly washed and patted dry, gently but thoroughly massage a thin film of azelaic acid  cream into the affected area twice daily, in the morning and evening. 30 g 0   azelastine  (ASTELIN ) 0.1 % nasal spray Place 1 spray into both nostrils 2 (two) times daily. Use in each nostril as directed 30 mL 12   betamethasone  valerate ointment (VALISONE ) 0.1 % Apply 1 application topically 2 (two) times daily. 30 g 0   clotrimazole -betamethasone  (LOTRISONE ) cream Apply 1 Application topically 2 (two) times daily. 30 g 0   fluticasone  (FLONASE ) 50 MCG/ACT nasal spray USE 2 SPRAYS IN EACH NOSTRIL DAILY 48 g 3   Levocetirizine Dihydrochloride  (XYZAL  PO) Take by mouth.     Multiple Vitamin (MULTIVITAMIN) tablet Take 1 tablet by mouth daily.     norethindrone  (MICRONOR ) 0.35 MG tablet Take 1 tablet (0.35 mg total) by mouth daily. 84 tablet 1   Vitamin D, Ergocalciferol, (DRISDOL) 1.25 MG (50000 UNIT) CAPS capsule Take  50,000 Units by mouth every 7 (seven) days.     No facility-administered medications prior to visit.    No Known Allergies  Review of Systems  Constitutional:  Negative for chills, fever and malaise/fatigue.  HENT:  Negative for congestion and hearing loss.   Eyes:  Negative for blurred vision and discharge.  Respiratory:  Negative for cough, sputum production and shortness of breath.   Cardiovascular:  Negative for  chest pain, palpitations and leg swelling.  Gastrointestinal:  Negative for abdominal pain, blood in stool, constipation, diarrhea, heartburn, nausea and vomiting.  Genitourinary:  Negative for dysuria, frequency, hematuria and urgency.  Musculoskeletal:  Negative for back pain, falls and myalgias.  Skin:  Negative for rash.  Neurological:  Negative for dizziness, sensory change, loss of consciousness, weakness and headaches.  Endo/Heme/Allergies:  Negative for environmental allergies. Does not bruise/bleed easily.  Psychiatric/Behavioral:  Negative for depression and suicidal ideas. The patient is not nervous/anxious and does not have insomnia.        Objective:    Physical Exam Vitals and nursing note reviewed.  Constitutional:      General: She is not in acute distress.    Appearance: Normal appearance. She is well-developed.  HENT:     Head: Normocephalic and atraumatic.     Right Ear: Tympanic membrane, ear canal and external ear normal. There is no impacted cerumen.     Left Ear: Tympanic membrane, ear canal and external ear normal. There is no impacted cerumen.     Nose: Nose normal.     Mouth/Throat:     Mouth: Mucous membranes are moist.     Pharynx: Oropharynx is clear. No oropharyngeal exudate or posterior oropharyngeal erythema.  Eyes:     General: No scleral icterus.       Right eye: No discharge.        Left eye: No discharge.     Conjunctiva/sclera: Conjunctivae normal.     Pupils: Pupils are equal, round, and reactive to light.  Neck:      Thyroid: No thyromegaly or thyroid tenderness.     Vascular: No JVD.  Cardiovascular:     Rate and Rhythm: Normal rate and regular rhythm.     Heart sounds: Normal heart sounds. No murmur heard. Pulmonary:     Effort: Pulmonary effort is normal. No respiratory distress.     Breath sounds: Normal breath sounds.  Abdominal:     General: Bowel sounds are normal. There is no distension.     Palpations: Abdomen is soft. There is no mass.     Tenderness: There is no abdominal tenderness. There is no guarding or rebound.  Musculoskeletal:        General: Normal range of motion.     Cervical back: Normal range of motion and neck supple.     Right lower leg: No edema.     Left lower leg: No edema.  Lymphadenopathy:     Cervical: No cervical adenopathy.  Skin:    General: Skin is warm and dry.     Findings: No erythema or rash.  Neurological:     Mental Status: She is alert and oriented to person, place, and time.     Cranial Nerves: No cranial nerve deficit.     Deep Tendon Reflexes: Reflexes are normal and symmetric.  Psychiatric:        Mood and Affect: Mood normal.        Behavior: Behavior normal.        Thought Content: Thought content normal.        Judgment: Judgment normal.     BP 120/88   Pulse (!) 121   Temp 98.2 F (36.8 C) (Oral)   Resp 18   Ht 5' 5.35 (1.66 m)   Wt 146 lb (66.2 kg)   SpO2 100%   BMI 24.04 kg/m  Wt Readings from Last 3 Encounters:  09/03/24 146 lb (66.2 kg)  04/16/24 145 lb (65.8 kg)  10/10/23 149 lb (67.6 kg)    Diabetic Foot Exam - Simple   No data filed    Lab Results  Component Value Date   WBC 6.8 09/03/2024   HGB 13.8 09/03/2024   HCT 41.4 09/03/2024   PLT 293.0 09/03/2024   GLUCOSE 75 09/03/2024   CHOL 200 09/03/2024   TRIG 57.0 09/03/2024   HDL 81.00 09/03/2024   LDLCALC 108 (H) 09/03/2024   ALT 10 09/03/2024   AST 18 09/03/2024   NA 133 (L) 09/03/2024   K 4.3 09/03/2024   CL 99 09/03/2024   CREATININE 0.62 09/03/2024    BUN 13 09/03/2024   CO2 26 09/03/2024   TSH 2.18 09/03/2024    Lab Results  Component Value Date   TSH 2.18 09/03/2024   Lab Results  Component Value Date   WBC 6.8 09/03/2024   HGB 13.8 09/03/2024   HCT 41.4 09/03/2024   MCV 93.7 09/03/2024   PLT 293.0 09/03/2024   Lab Results  Component Value Date   NA 133 (L) 09/03/2024   K 4.3 09/03/2024   CO2 26 09/03/2024   GLUCOSE 75 09/03/2024   BUN 13 09/03/2024   CREATININE 0.62 09/03/2024   BILITOT 0.5 09/03/2024   ALKPHOS 41 09/03/2024   AST 18 09/03/2024   ALT 10 09/03/2024   PROT 7.4 09/03/2024   ALBUMIN 4.7 09/03/2024   CALCIUM 9.4 09/03/2024   GFR 108.46 09/03/2024   Lab Results  Component Value Date   CHOL 200 09/03/2024   Lab Results  Component Value Date   HDL 81.00 09/03/2024   Lab Results  Component Value Date   LDLCALC 108 (H) 09/03/2024   Lab Results  Component Value Date   TRIG 57.0 09/03/2024   Lab Results  Component Value Date   CHOLHDL 2 09/03/2024   No results found for: HGBA1C     Assessment & Plan:  Preventative health care Assessment & Plan: See AVS Check labs  Ghm utd Health Maintenance  Topic Date Due   Hepatitis B Vaccines 19-59 Average Risk (1 of 3 - 19+ 3-dose series) Never done   HPV VACCINES (2 - 3-dose SCDM series) 12/01/2007   COVID-19 Vaccine (3 - Moderna risk series) 05/09/2020   DTaP/Tdap/Td (2 - Td or Tdap) 05/21/2024   Mammogram  10/12/2024   Influenza Vaccine  03/19/2025 (Originally 07/20/2024)   Cervical Cancer Screening (HPV/Pap Cotest)  10/09/2028   Hepatitis C Screening  Completed   HIV Screening  Completed   Pneumococcal Vaccine  Aged Out   Meningococcal B Vaccine  Aged Out     Orders: -     CBC with Differential/Platelet -     Comprehensive metabolic panel with GFR -     Lipid panel -     TSH  Need for Tdap vaccination -     Tdap vaccine greater than or equal to 7yo IM  Assessment and Plan Assessment & Plan Elevated blood pressure readings    Blood pressure readings have been elevated on multiple occasions, including today's reading of 140/98 mmHg and a previous reading of 130/90 mmHg in April. Adderall use may contribute to this elevation. She reports high readings at work, though the machine's reliability is questionable. Discussed the potential need for medication if readings remain consistently elevated. Recheck and monitor blood pressure regularly. Consider antihypertensive medication if it remains elevated.  Knee pain   She experiences intermittent knee pain that worsens with prolonged sitting. The  pain is manageable and not severe.  Adult Wellness Visit   During the routine adult wellness visit, no new surgeries or significant health changes were reported. She maintains a regular exercise routine with walking and has regular visits to the eye doctor, dentist, and gynecologist. Administer tetanus vaccine and perform lab work.     Waris Rodger R Lowne Chase, DO

## 2024-09-03 NOTE — Assessment & Plan Note (Signed)
 See AVS Check labs  Ghm utd Health Maintenance  Topic Date Due   Hepatitis B Vaccines 19-59 Average Risk (1 of 3 - 19+ 3-dose series) Never done   HPV VACCINES (2 - 3-dose SCDM series) 12/01/2007   COVID-19 Vaccine (3 - Moderna risk series) 05/09/2020   DTaP/Tdap/Td (2 - Td or Tdap) 05/21/2024   Mammogram  10/12/2024   Influenza Vaccine  03/19/2025 (Originally 07/20/2024)   Cervical Cancer Screening (HPV/Pap Cotest)  10/09/2028   Hepatitis C Screening  Completed   HIV Screening  Completed   Pneumococcal Vaccine  Aged Out   Meningococcal B Vaccine  Aged Out

## 2024-09-07 ENCOUNTER — Ambulatory Visit: Payer: Self-pay | Admitting: Family Medicine

## 2024-09-21 ENCOUNTER — Encounter: Payer: Self-pay | Admitting: Family Medicine

## 2024-09-21 DIAGNOSIS — R251 Tremor, unspecified: Secondary | ICD-10-CM

## 2024-09-21 DIAGNOSIS — F988 Other specified behavioral and emotional disorders with onset usually occurring in childhood and adolescence: Secondary | ICD-10-CM

## 2024-09-21 MED ORDER — AMPHETAMINE-DEXTROAMPHETAMINE 20 MG PO TABS: 20.0000 mg | ORAL_TABLET | Freq: Two times a day (BID) | ORAL | 0 refills | Status: DC

## 2024-09-21 MED ORDER — ALPRAZOLAM 0.25 MG PO TABS: 0.2500 mg | ORAL_TABLET | Freq: Three times a day (TID) | ORAL | 0 refills | Status: DC | PRN

## 2024-09-21 NOTE — Telephone Encounter (Signed)
 Requesting: alprazolam  0.25mg  and Adderall 20mg   Contract: 04/16/24 UDS: 04/16/24 Last Visit: 09/03/24 Next Visit: None Last Refill: see med list   Please Advise

## 2024-10-09 NOTE — Progress Notes (Unsigned)
   Destiny Rangel 05-04-1981 [id number]   History:  43 y.o. G1P0010 presents for annual exam without GYN complaints. POPs. Spotting every 3-4 weeks. 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  Gynecologic History Patient's last menstrual period was 10/05/2024.   Contraception: oral progesterone-only contraceptive Sexually active: Yes  Health Maintenance Last Pap: 10/10/2023. Results were: ASCUS neg HPV, 3-year repeat Last mammogram: 10/13/2023. Results were: Normal Last colonoscopy: Not indicated Last Dexa: Not indicated     10/10/2024   10:24 AM  Depression screen PHQ 2/9  Decreased Interest 0  Down, Depressed, Hopeless 0  PHQ - 2 Score 0     Past medical history, past surgical history, family history and social history were all reviewed and documented in the EPIC chart. Married. 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  ROS:  A ROS was performed and pertinent positives and negatives are included.  Exam:  Vitals:   10/10/24 1019  BP: 110/74  Resp: 16  SpO2: 99%  Weight: 148 lb (67.1 kg)  Height: 5' 5.5 (1.664 m)       Body mass index is 24.25 kg/m.  General appearance:  Normal Thyroid:  Symmetrical, normal in size, without palpable masses or nodularity. Respiratory  Auscultation:  Clear without wheezing or rhonchi Cardiovascular  Auscultation:  Regular rate, without rubs, murmurs or gallops  Edema/varicosities:  Not grossly evident Abdominal  Soft,nontender, without masses, guarding or rebound.  Liver/spleen:  No organomegaly noted  Hernia:  None appreciated  Skin  Inspection:  Grossly normal   Breasts: Examined lying and sitting.   Right: Without masses, retractions, discharge or axillary adenopathy.   Left: Without masses, retractions, discharge or axillary adenopathy. Pelvic: External genitalia:  no lesions              Urethra:  normal appearing urethra with no masses, tenderness or lesions              Bartholins and  Skenes: normal                 Vagina: normal appearing vagina with normal color and discharge, no lesions              Cervix: no lesions Bimanual Exam:  Uterus:  no masses or tenderness              Adnexa: no mass, fullness, tenderness              Rectovaginal: Deferred              Anus:  normal, no lesions  Destiny Rangel, CMA present as chaperone.   Assessment/Plan:  43 y.o. G1P0010 for annual exam.   Well female exam with routine gynecological exam - Education provided on SBEs, importance of preventative screenings, current guidelines, high calcium diet, regular exercise, and multivitamin daily. Labs with PCP.   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 2024 ASCUS neg HPV. Will repeat at 3-year interval per guidelines.   Encounter for surveillance of contraceptive pills - Plan: norethindrone  (MICRONOR ) 0.35 MG tablet daily. Taking as prescribed.   Screening for breast cancer - Normal mammogram history.  Continue annual screenings.  Normal breast exam today.   Screening for colon cancer - Average risk. Will start screenings at age 36.   Return in about 1 year (around 10/10/2025) for Annual.       Destiny Rangel, 10:41 AM 10/10/2024

## 2024-10-10 ENCOUNTER — Ambulatory Visit (INDEPENDENT_AMBULATORY_CARE_PROVIDER_SITE_OTHER): Payer: BC Managed Care – PPO | Admitting: Nurse Practitioner

## 2024-10-10 ENCOUNTER — Encounter: Payer: Self-pay | Admitting: Nurse Practitioner

## 2024-10-10 VITALS — BP 110/74 | Resp 16 | Ht 65.5 in | Wt 148.0 lb

## 2024-10-10 DIAGNOSIS — Z3041 Encounter for surveillance of contraceptive pills: Secondary | ICD-10-CM

## 2024-10-10 DIAGNOSIS — Z1331 Encounter for screening for depression: Secondary | ICD-10-CM

## 2024-10-10 DIAGNOSIS — Z01419 Encounter for gynecological examination (general) (routine) without abnormal findings: Secondary | ICD-10-CM | POA: Diagnosis not present

## 2024-10-10 MED ORDER — NORETHINDRONE 0.35 MG PO TABS: 1.0000 | ORAL_TABLET | Freq: Every day | ORAL | 3 refills | Status: AC

## 2024-10-16 DIAGNOSIS — R92323 Mammographic fibroglandular density, bilateral breasts: Secondary | ICD-10-CM | POA: Diagnosis not present

## 2024-10-16 DIAGNOSIS — Z1231 Encounter for screening mammogram for malignant neoplasm of breast: Secondary | ICD-10-CM | POA: Diagnosis not present

## 2024-10-16 LAB — HM MAMMOGRAPHY

## 2024-10-17 ENCOUNTER — Encounter: Payer: Self-pay | Admitting: Family Medicine

## 2024-10-22 ENCOUNTER — Other Ambulatory Visit: Payer: Self-pay | Admitting: Family Medicine

## 2024-10-22 ENCOUNTER — Encounter: Payer: Self-pay | Admitting: Family Medicine

## 2024-10-22 DIAGNOSIS — R251 Tremor, unspecified: Secondary | ICD-10-CM

## 2024-10-22 DIAGNOSIS — F988 Other specified behavioral and emotional disorders with onset usually occurring in childhood and adolescence: Secondary | ICD-10-CM

## 2024-10-22 MED ORDER — AMPHETAMINE-DEXTROAMPHETAMINE 20 MG PO TABS: 20.0000 mg | ORAL_TABLET | Freq: Two times a day (BID) | ORAL | 0 refills | Status: DC

## 2024-10-22 MED ORDER — ALPRAZOLAM 0.25 MG PO TABS: 0.2500 mg | ORAL_TABLET | Freq: Three times a day (TID) | ORAL | 0 refills | Status: DC | PRN

## 2024-10-22 NOTE — Telephone Encounter (Signed)
 Requesting: Adderall 20mg  and alprazolam  0.25mg   Contract:04/16/24 UDS: 04/16/24 Last Visit: 09/03/24 Next Visit: None Last Refill: see med list   Please Advise

## 2024-11-22 ENCOUNTER — Other Ambulatory Visit: Payer: Self-pay | Admitting: Family

## 2024-11-22 ENCOUNTER — Encounter: Payer: Self-pay | Admitting: Family Medicine

## 2024-11-22 DIAGNOSIS — F988 Other specified behavioral and emotional disorders with onset usually occurring in childhood and adolescence: Secondary | ICD-10-CM

## 2024-11-22 DIAGNOSIS — R251 Tremor, unspecified: Secondary | ICD-10-CM

## 2024-11-22 MED ORDER — AMPHETAMINE-DEXTROAMPHETAMINE 20 MG PO TABS: 20.0000 mg | ORAL_TABLET | Freq: Two times a day (BID) | ORAL | 0 refills | Status: DC

## 2024-11-22 MED ORDER — ALPRAZOLAM 0.25 MG PO TABS: 0.2500 mg | ORAL_TABLET | Freq: Three times a day (TID) | ORAL | 0 refills | Status: DC | PRN

## 2024-11-22 NOTE — Telephone Encounter (Signed)
 Requesting: alprazolam  and Adderall Contract:04/16/24 UDS: 04/16/24 Last Visit: 09/03/24 Next Visit: None Last Refill: see med list   Please Advise

## 2024-12-21 ENCOUNTER — Encounter: Payer: Self-pay | Admitting: Family Medicine

## 2024-12-21 DIAGNOSIS — R251 Tremor, unspecified: Secondary | ICD-10-CM

## 2024-12-21 DIAGNOSIS — F988 Other specified behavioral and emotional disorders with onset usually occurring in childhood and adolescence: Secondary | ICD-10-CM

## 2024-12-21 MED ORDER — AMPHETAMINE-DEXTROAMPHETAMINE 20 MG PO TABS: 20.0000 mg | ORAL_TABLET | Freq: Two times a day (BID) | ORAL | 0 refills | Status: DC

## 2024-12-21 MED ORDER — ALPRAZOLAM 0.25 MG PO TABS: 0.2500 mg | ORAL_TABLET | Freq: Three times a day (TID) | ORAL | 0 refills | Status: DC | PRN

## 2024-12-21 NOTE — Telephone Encounter (Signed)
 Requesting: Xanax  & Adderall Contract: 04/16/2024 UDS: 04/16/2024 Last OV: 09/03/2024 Next OV: n/a Last Refill:11/22/2024, #30--0rf & #60--0 RF  Database:   Please advise

## 2025-01-22 ENCOUNTER — Encounter: Payer: Self-pay | Admitting: Family Medicine

## 2025-01-22 DIAGNOSIS — R251 Tremor, unspecified: Secondary | ICD-10-CM

## 2025-01-22 DIAGNOSIS — F988 Other specified behavioral and emotional disorders with onset usually occurring in childhood and adolescence: Secondary | ICD-10-CM

## 2025-01-22 MED ORDER — AMPHETAMINE-DEXTROAMPHETAMINE 20 MG PO TABS: 20.0000 mg | ORAL_TABLET | Freq: Two times a day (BID) | ORAL | 0 refills | Status: AC

## 2025-01-22 MED ORDER — ALPRAZOLAM 0.25 MG PO TABS: 0.2500 mg | ORAL_TABLET | Freq: Three times a day (TID) | ORAL | 0 refills | Status: AC | PRN

## 2025-01-22 NOTE — Telephone Encounter (Signed)
 Requesting: Xanax  & Adderall Contract: 04/16/2024 UDS: 04/16/2024 Last OV: 09/03/2024 Next OV: n/a Last Refill: 12/21/2024 #30--0 RF, #60--0 RF Database:   Please advise

## 2025-10-11 ENCOUNTER — Ambulatory Visit: Admitting: Nurse Practitioner
